# Patient Record
Sex: Male | Born: 1970 | Race: Black or African American | Hispanic: No | Marital: Single | Smoking: Current every day smoker
Health system: Southern US, Community
[De-identification: ages and names within clinical notes are randomized; demographics above are authoritative.]

## PROBLEM LIST (undated history)

## (undated) DIAGNOSIS — H9190 Unspecified hearing loss, unspecified ear: Secondary | ICD-10-CM

## (undated) HISTORY — PX: EYE SURGERY: SHX253

---

## 2013-10-09 ENCOUNTER — Ambulatory Visit: Payer: Self-pay | Admitting: Family Medicine

## 2013-10-13 ENCOUNTER — Ambulatory Visit: Payer: Self-pay | Admitting: Physician Assistant

## 2014-11-02 ENCOUNTER — Ambulatory Visit
Admit: 2014-11-02 | Disposition: A | Discharge status: Home / Self Care | Payer: Self-pay | Attending: Family Medicine | Admitting: Family Medicine

## 2016-09-13 ENCOUNTER — Ambulatory Visit
Admission: EM | Admit: 2016-09-13 | Discharge: 2016-09-13 | Disposition: A | Discharge status: Home / Self Care | Payer: Medicaid Other | Attending: Family Medicine | Admitting: Family Medicine

## 2016-09-13 ENCOUNTER — Encounter: Payer: Self-pay | Admitting: Emergency Medicine

## 2016-09-13 DIAGNOSIS — L03213 Periorbital cellulitis: Secondary | ICD-10-CM | POA: Diagnosis not present

## 2016-09-13 DIAGNOSIS — J0100 Acute maxillary sinusitis, unspecified: Secondary | ICD-10-CM

## 2016-09-13 MED ORDER — SULFAMETHOXAZOLE-TRIMETHOPRIM 800-160 MG PO TABS: 1.0000 | ORAL_TABLET | Freq: Two times a day (BID) | ORAL | 0 refills | Status: DC

## 2016-09-13 NOTE — ED Provider Notes (Signed)
[facility]    CSN: [id number] Arrival date & time: 09/13/16  0901     History   Chief Complaint Chief Complaint  Patient presents with  . Facial Pain  . Eye Problem    HPI Wayne Ward is a 46 y.o. male.   The history is provided by the patient.  Eye Problem  Location:  Left eye (lower eyelid) Quality:  Tearing Severity:  Mild Onset quality:  Sudden Duration:  1 week Timing:  Constant Progression:  Worsening Context: not burn, not chemical exposure, not contact lens problem, not direct trauma, not foreign body, not using machinery, not scratch, not smoke exposure and not UV exposure   Relieved by:  None tried Associated symptoms: headaches   URI  Presenting symptoms: congestion, facial pain and rhinorrhea   Severity:  Moderate Onset quality:  Sudden Duration:  3 weeks Timing:  Constant Progression:  Worsening Chronicity:  New Relieved by:  Nothing Ineffective treatments:  OTC medications Associated symptoms: headaches and sinus pain   Associated symptoms: no wheezing   Risk factors: sick contacts   Risk factors: not elderly, no chronic cardiac disease, no chronic kidney disease, no chronic respiratory disease, no diabetes mellitus, no immunosuppression, no recent illness and no recent travel     History reviewed. No pertinent past medical history.  There are no active problems to display for this patient.   Past Surgical History:  Procedure Laterality Date  . EYE SURGERY         Home Medications    Prior to Admission medications   Medication Sig Start Date End Date Taking? Authorizing Provider  sulfamethoxazole-trimethoprim (BACTRIM DS,SEPTRA DS) 800-160 MG tablet Take 1 tablet by mouth 2 (two) times daily. 09/13/16   Payton Mccallum, MD    Family History History reviewed. No pertinent family history.  Social History Social History  Substance Use Topics  . Smoking status: Current Every Day Smoker    Types: Cigarettes  .  Smokeless tobacco: Never Used  . Alcohol use Yes     Allergies   Penicillins   Review of Systems Review of Systems  HENT: Positive for congestion, rhinorrhea and sinus pain.   Respiratory: Negative for wheezing.   Neurological: Positive for headaches.     Physical Exam Triage Vital Signs ED Triage Vitals  Enc Vitals Group     BP 09/13/16 0922 (S) (!) 144/106     Pulse Rate 09/13/16 0922 88     Resp 09/13/16 0922 16     Temp 09/13/16 0922 98.1 F (36.7 C)     Temp Source 09/13/16 0922 Oral     SpO2 09/13/16 0922 100 %     Weight 09/13/16 0920 160 lb (72.6 kg)     Height 09/13/16 0920 6\' 1"  (1.854 m)     Head Circumference --      Peak Flow --      Pain Score 09/13/16 0924 4     Pain Loc --      Pain Edu? --      Excl. in GC? --    No data found.   Updated Vital Signs BP (S) (!) 144/106 (BP Location: Left Arm)   Pulse 88   Temp 98.1 F (36.7 C) (Oral)   Resp 16   Ht 6\' 1"  (1.854 m)   Wt 160 lb (72.6 kg)   SpO2 100%   BMI 21.11 kg/m   Visual Acuity Right Eye Distance:   Left Eye Distance:  Bilateral Distance:    Right Eye Near:   Left Eye Near:    Bilateral Near:     Physical Exam  Constitutional: He appears well-developed and well-nourished. No distress.  HENT:  Head: Normocephalic and atraumatic.  Right Ear: Tympanic membrane, external ear and ear canal normal.  Left Ear: Tympanic membrane, external ear and ear canal normal.  Nose: Right sinus exhibits maxillary sinus tenderness and frontal sinus tenderness. Left sinus exhibits maxillary sinus tenderness and frontal sinus tenderness.  Mouth/Throat: Uvula is midline, oropharynx is clear and moist and mucous membranes are normal. No oropharyngeal exudate or tonsillar abscesses.  Eyes: Conjunctivae and EOM are normal. Pupils are equal, round, and reactive to light. Right eye exhibits no discharge. Left eye exhibits no discharge. No scleral icterus.  Left lower eyelid with mild tenderness to  palpation, skin erythema, blanchable and warmth  Neck: Normal range of motion. Neck supple. No tracheal deviation present. No thyromegaly present.  Cardiovascular: Normal rate, regular rhythm and normal heart sounds.   Pulmonary/Chest: Effort normal and breath sounds normal. No stridor. No respiratory distress. He has no wheezes. He has no rales. He exhibits no tenderness.  Lymphadenopathy:    He has no cervical adenopathy.  Neurological: He is alert.  Skin: Skin is warm and dry. No rash noted. He is not diaphoretic.  Nursing note and vitals reviewed.    UC Treatments / Results  Labs (all labs ordered are listed, but only abnormal results are displayed) Labs Reviewed - No data to display  EKG  EKG Interpretation None       Radiology No results found.  Procedures Procedures (including critical care time)  Medications Ordered in UC Medications - No data to display   Initial Impression / Assessment and Plan / UC Course  I have reviewed the triage vital signs and the nursing notes.  Pertinent labs & imaging results that were available during my care of the patient were reviewed by me and considered in my medical decision making (see chart for details).       Final Clinical Impressions(s) / UC Diagnoses   Final diagnoses:  Acute maxillary sinusitis, recurrence not specified  Periorbital cellulitis of left eye    New Prescriptions Discharge Medication List as of 09/13/2016 10:06 AM    START taking these medications   Details  sulfamethoxazole-trimethoprim (BACTRIM DS,SEPTRA DS) 800-160 MG tablet Take 1 tablet by mouth 2 (two) times daily., Starting Tue 09/13/2016, Normal       1.  diagnosis reviewed with patient 2. rx as per orders above; reviewed possible side effects, interactions, risks and benefits  3. Recommend supportive treatment with warm compresses, otc analgesics 4. Follow-up prn if symptoms worsen or don't improve   Payton Mccallumrlando Vontae Court, MD 09/13/16  1104

## 2016-09-13 NOTE — ED Notes (Signed)
Sign Language Interpreter was used during triage visit.

## 2016-09-13 NOTE — ED Triage Notes (Signed)
Patient c/o sinus pain and congestion, HAs and swelling on the left side of his face for almost month.  Patient denies fever.

## 2021-08-30 ENCOUNTER — Emergency Department (HOSPITAL_COMMUNITY)
Admission: EM | Admit: 2021-08-30 | Discharge: 2021-08-30 | Disposition: A | Discharge status: Home / Self Care | Payer: Medicare Other | Source: Home / Self Care

## 2021-08-31 ENCOUNTER — Encounter (HOSPITAL_COMMUNITY): Payer: Self-pay | Admitting: *Deleted

## 2021-08-31 ENCOUNTER — Emergency Department (HOSPITAL_COMMUNITY)
Admission: EM | Admit: 2021-08-31 | Discharge: 2021-08-31 | Disposition: A | Discharge status: Home / Self Care | Payer: Medicare Other | Attending: Emergency Medicine | Admitting: Emergency Medicine

## 2021-08-31 ENCOUNTER — Emergency Department (HOSPITAL_COMMUNITY): Payer: Medicare Other

## 2021-08-31 DIAGNOSIS — S80911A Unspecified superficial injury of right knee, initial encounter: Secondary | ICD-10-CM | POA: Diagnosis present

## 2021-08-31 DIAGNOSIS — W1830XA Fall on same level, unspecified, initial encounter: Secondary | ICD-10-CM | POA: Diagnosis not present

## 2021-08-31 DIAGNOSIS — Z79899 Other long term (current) drug therapy: Secondary | ICD-10-CM | POA: Insufficient documentation

## 2021-08-31 DIAGNOSIS — S81811A Laceration without foreign body, right lower leg, initial encounter: Secondary | ICD-10-CM | POA: Diagnosis not present

## 2021-08-31 DIAGNOSIS — Z23 Encounter for immunization: Secondary | ICD-10-CM | POA: Diagnosis not present

## 2021-08-31 HISTORY — DX: Unspecified hearing loss, unspecified ear: H91.90

## 2021-08-31 MED ORDER — TETANUS-DIPHTH-ACELL PERTUSSIS 5-2.5-18.5 LF-MCG/0.5 IM SUSY
0.5000 mL | PREFILLED_SYRINGE | Freq: Once | INTRAMUSCULAR | Status: AC
  Administered 2021-08-31: 0.5 mL via INTRAMUSCULAR
  Filled 2021-08-31: qty 0.5

## 2021-08-31 MED ORDER — DOXYCYCLINE HYCLATE 100 MG PO CAPS: 100.0000 mg | ORAL_CAPSULE | Freq: Two times a day (BID) | ORAL | 0 refills | Status: AC

## 2021-08-31 MED ORDER — LIDOCAINE-EPINEPHRINE (PF) 2 %-1:200000 IJ SOLN
10.0000 mL | Freq: Once | INTRAMUSCULAR | Status: AC
  Administered 2021-08-31: 10 mL
  Filled 2021-08-31: qty 20

## 2021-08-31 NOTE — Discharge Instructions (Signed)
Received a total of 3 sutures, please abstain from getting it wet for the first 24 hours after that like you to rinse out the wound and change out the dressings 2 times daily, started you on antibiotics please take as prescribed.  May use over-the-counter pain medication as needed  Follow-up next 10 to 12 days for suture removal may come back here, urgent care, PCP  Come back to the emergency department if you increased redness, pain, swelling or discharge from the area develop chest pain, shortness of breath, severe abdominal pain, uncontrolled nausea, vomiting, diarrhea.

## 2021-08-31 NOTE — ED Provider Notes (Signed)
[facility] EMERGENCY DEPARTMENT Provider Note   CSN: [id number] Arrival date & time: 08/31/21  1406     History  Chief Complaint  Patient presents with   Laceration    Wayne Ward is a 51 y.o. male.  HPI  Patient with medical history including deaf presents with complaints of a laceration to his right knee.  Patient's brother was at bedside and was able to translate for me.  Patient states that yesterday he had fallen and his knee fell onto a rake, he denies hitting his head, losing conscious, is not on anticoag's.  He states his only complaint is his right knee, he has a laceration which they cleaned out thoroughly, he has some slight pain in that area, denies any paresthesia or weakness in that leg, he is not immunocompromise, does not remember the last time he had his tetanus shot.  He denies any headaches, change in vision, paresthesia or weakness of the lower extremities, denies any neck pain back pain pain 4 extremities.    Home Medications Prior to Admission medications   Medication Sig Start Date End Date Taking? Authorizing Provider  doxycycline (VIBRAMYCIN) 100 MG capsule Take 1 capsule (100 mg total) by mouth 2 (two) times daily for 7 days. 08/31/21 09/07/21 Yes Carroll Sage, PA-C  lisinopril (ZESTRIL) 10 MG tablet Take 1 tablet by mouth daily. Patient not taking: Reported on 08/31/2021 09/02/18   [provider]  sulfamethoxazole-trimethoprim (BACTRIM DS,SEPTRA DS) 800-160 MG tablet Take 1 tablet by mouth 2 (two) times daily. Patient not taking: Reported on 08/31/2021 09/13/16   Payton Mccallum, MD      Allergies    Penicillins    Review of Systems   Review of Systems  Constitutional:  Negative for chills and fever.  Respiratory:  Negative for shortness of breath.   Cardiovascular:  Negative for chest pain.  Gastrointestinal:  Negative for abdominal pain.  Skin:  Positive for wound.  Neurological:  Negative for headaches.   Physical Exam Updated Vital  Signs BP (!) 153/93    Pulse 100    Temp 99.5 F (37.5 C) (Oral)    Resp 20    SpO2 100%  Physical Exam Vitals and nursing note reviewed.  Constitutional:      General: He is not in acute distress.    Appearance: He is not ill-appearing.  HENT:     Head: Normocephalic and atraumatic.     Nose: No congestion.  Eyes:     Conjunctiva/sclera: Conjunctivae normal.  Cardiovascular:     Rate and Rhythm: Normal rate and regular rhythm.     Pulses: Normal pulses.  Pulmonary:     Effort: Pulmonary effort is normal.  Musculoskeletal:     Comments: Patient has a noted V-shaped laceration on the anterior proximal aspect of the tibia, measuring proximately 4 cm in length, 3 mm in depth, wound gapes, noted skin flap, no noted tendon damage, no foreign body present, he has full range of motion, 5 out of 5 strength in his ankle and knee neurovascular fully intact please see picture for full detail  Skin:    General: Skin is warm and dry.  Neurological:     Mental Status: He is alert.     Comments: No facial asymmetry, no unilateral weakness present.  Psychiatric:        Mood and Affect: Mood normal.       ED Results / Procedures / Treatments   Labs (all labs ordered are listed, but  only abnormal results are displayed) Labs Reviewed - No data to display  EKG None  Radiology No results found.  Procedures .Marland KitchenLaceration Repair  Date/Time: 08/31/2021 6:23 PM Performed by: Carroll Sage, PA-C Authorized by: Carroll Sage, PA-C   Consent:    Consent obtained:  Verbal   Consent given by:  Patient   Risks discussed:  Infection, pain, retained foreign body, need for additional repair, poor cosmetic result, tendon damage, vascular damage, poor wound healing and nerve damage   Alternatives discussed:  No treatment, delayed treatment, observation and referral Universal protocol:    Patient identity confirmed:  Verbally with patient Anesthesia:    Anesthesia method:  Local  infiltration   Local anesthetic:  Lidocaine 2% WITH epi Laceration details:    Location:  Leg   Leg location:  R upper leg   Length (cm):  4   Depth (mm):  3 Pre-procedure details:    Preparation:  Patient was prepped and draped in usual sterile fashion and imaging obtained to evaluate for foreign bodies Exploration:    Limited defect created (wound extended): no     Imaging obtained: x-ray     Imaging outcome: foreign body not noted     Wound exploration: wound explored through full range of motion and entire depth of wound visualized     Contaminated: no   Treatment:    Area cleansed with:  Saline   Amount of cleaning:  Standard   Visualized foreign bodies/material removed: no     Debridement:  None Skin repair:    Repair method:  Sutures   Suture size:  4-0   Suture material:  Prolene   Suture technique:  Simple interrupted   Number of sutures:  3 Approximation:    Approximation:  Loose Repair type:    Repair type:  Simple Post-procedure details:    Dressing:  Non-adherent dressing   Procedure completion:  Tolerated well, no immediate complications    Medications Ordered in ED Medications  Tdap (BOOSTRIX) injection 0.5 mL (0.5 mLs Intramuscular Given 08/31/21 1635)  lidocaine-EPINEPHrine (XYLOCAINE W/EPI) 2 %-1:200000 (PF) injection 10 mL (10 mLs Infiltration Given 08/31/21 1635)    ED Course/ Medical Decision Making/ A&P                           Medical Decision Making Amount and/or Complexity of Data Reviewed Radiology: ordered.  Risk Prescription drug management.   This patient presents to the ED for concern of fall, this involves an extensive number of treatment options, and is a complaint that carries with it a high risk of complications and morbidity.  The differential diagnosis includes fracture, dislocation, tendon damage    Additional history obtained:  Additional history obtained from brother who is at bedside    Co morbidities that complicate  the patient evaluation  N/A  Social Determinants of Health:  Legally deaf    Lab Tests:  I Ordered, and personally interpreted labs.  The pertinent results include: N/A   Imaging Studies ordered:  I ordered imaging studies including the x-ray I independently visualized and interpreted imaging which showed negative for acute findings I agree with the radiologist interpretation    Reevaluation:  Since patient's wound gaped and has a skin flap will recommend loose sutures to decrease infection risk and to assist with the healing process.  Patient was agreeable with this and tolerated the procedure well. he received 3 sutures.  Neurovascular was fully intact  after the procedure.  Patient's tetanus shot was updated.     Rule out  Low suspicion for fracture or dislocation as x-ray does not reveal any acute findings. low suspicion for  tendon damage as area was palpated no gross defects noted, he had full range of motion in his knee and ankle.   Low suspicion for compartment syndrome as area was palpated it was soft to the touch, neurovascular fully intact before and after the procedure    Dispostion and problem list  After consideration of the diagnostic results and the patients response to treatment, I feel that the patent would benefit from discharge.  Laceration-received a total of 3 sutures, will start him on antibiotics, have him follow-up next 10 to 12 days for suture removal.  Given strict return precautions.            Final Clinical Impression(s) / ED Diagnoses Final diagnoses:  Laceration of right lower extremity, initial encounter    Rx / DC Orders ED Discharge Orders          Ordered    doxycycline (VIBRAMYCIN) 100 MG capsule  2 times daily        08/31/21 1825              Carroll Sage, PA-C 08/31/21 1826    Pollyann Savoy, MD 08/31/21 2320

## 2021-08-31 NOTE — ED Notes (Signed)
Band-aid applied

## 2021-08-31 NOTE — ED Triage Notes (Signed)
Laceration to right knee, fell on a rake yeasterday

## 2022-05-09 ENCOUNTER — Emergency Department (HOSPITAL_COMMUNITY): Payer: Medicare Other

## 2022-05-09 ENCOUNTER — Inpatient Hospital Stay (HOSPITAL_COMMUNITY)
Admission: EM | Admit: 2022-05-09 | Discharge: 2022-05-12 | DRG: 917 | Disposition: A | Discharge status: Home / Self Care | Payer: Medicare Other | Attending: Critical Care Medicine | Admitting: Critical Care Medicine

## 2022-05-09 DIAGNOSIS — T510X1A Toxic effect of ethanol, accidental (unintentional), initial encounter: Secondary | ICD-10-CM | POA: Diagnosis present

## 2022-05-09 DIAGNOSIS — E162 Hypoglycemia, unspecified: Secondary | ICD-10-CM | POA: Diagnosis present

## 2022-05-09 DIAGNOSIS — E876 Hypokalemia: Secondary | ICD-10-CM | POA: Diagnosis present

## 2022-05-09 DIAGNOSIS — R4182 Altered mental status, unspecified: Secondary | ICD-10-CM | POA: Diagnosis not present

## 2022-05-09 DIAGNOSIS — T50901A Poisoning by unspecified drugs, medicaments and biological substances, accidental (unintentional), initial encounter: Secondary | ICD-10-CM | POA: Diagnosis present

## 2022-05-09 DIAGNOSIS — R9431 Abnormal electrocardiogram [ECG] [EKG]: Secondary | ICD-10-CM | POA: Diagnosis present

## 2022-05-09 DIAGNOSIS — R451 Restlessness and agitation: Secondary | ICD-10-CM

## 2022-05-09 DIAGNOSIS — J9601 Acute respiratory failure with hypoxia: Secondary | ICD-10-CM | POA: Diagnosis present

## 2022-05-09 DIAGNOSIS — T50904A Poisoning by unspecified drugs, medicaments and biological substances, undetermined, initial encounter: Principal | ICD-10-CM

## 2022-05-09 DIAGNOSIS — J690 Pneumonitis due to inhalation of food and vomit: Secondary | ICD-10-CM | POA: Diagnosis present

## 2022-05-09 DIAGNOSIS — H9190 Unspecified hearing loss, unspecified ear: Secondary | ICD-10-CM | POA: Diagnosis present

## 2022-05-09 DIAGNOSIS — Y902 Blood alcohol level of 40-59 mg/100 ml: Secondary | ICD-10-CM | POA: Diagnosis present

## 2022-05-09 DIAGNOSIS — H548 Legal blindness, as defined in USA: Secondary | ICD-10-CM | POA: Diagnosis present

## 2022-05-09 DIAGNOSIS — F1010 Alcohol abuse, uncomplicated: Secondary | ICD-10-CM | POA: Diagnosis present

## 2022-05-09 DIAGNOSIS — T405X1A Poisoning by cocaine, accidental (unintentional), initial encounter: Principal | ICD-10-CM | POA: Diagnosis present

## 2022-05-09 DIAGNOSIS — R739 Hyperglycemia, unspecified: Secondary | ICD-10-CM | POA: Diagnosis not present

## 2022-05-09 DIAGNOSIS — G928 Other toxic encephalopathy: Secondary | ICD-10-CM | POA: Diagnosis present

## 2022-05-09 DIAGNOSIS — Z880 Allergy status to penicillin: Secondary | ICD-10-CM

## 2022-05-09 DIAGNOSIS — F17210 Nicotine dependence, cigarettes, uncomplicated: Secondary | ICD-10-CM | POA: Diagnosis present

## 2022-05-09 DIAGNOSIS — F1410 Cocaine abuse, uncomplicated: Secondary | ICD-10-CM | POA: Diagnosis present

## 2022-05-09 DIAGNOSIS — I10 Essential (primary) hypertension: Secondary | ICD-10-CM | POA: Diagnosis present

## 2022-05-09 LAB — CBC WITH DIFFERENTIAL/PLATELET
Abs Immature Granulocytes: 0.04 10*3/uL (ref 0.00–0.07)
Basophils Absolute: 0 10*3/uL (ref 0.0–0.1)
Basophils Relative: 1 %
Eosinophils Absolute: 0.1 10*3/uL (ref 0.0–0.5)
Eosinophils Relative: 1 %
HCT: 41.5 % (ref 39.0–52.0)
Hemoglobin: 13.6 g/dL (ref 13.0–17.0)
Immature Granulocytes: 1 %
Lymphocytes Relative: 25 %
Lymphs Abs: 2 10*3/uL (ref 0.7–4.0)
MCH: 29.2 pg (ref 26.0–34.0)
MCHC: 32.8 g/dL (ref 30.0–36.0)
MCV: 89.2 fL (ref 80.0–100.0)
Monocytes Absolute: 1 10*3/uL (ref 0.1–1.0)
Monocytes Relative: 12 %
Neutro Abs: 4.9 10*3/uL (ref 1.7–7.7)
Neutrophils Relative %: 60 %
Platelets: 235 10*3/uL (ref 150–400)
RBC: 4.65 MIL/uL (ref 4.22–5.81)
RDW: 13.1 % (ref 11.5–15.5)
WBC: 8 10*3/uL (ref 4.0–10.5)
nRBC: 0 % (ref 0.0–0.2)

## 2022-05-09 LAB — ETHANOL: Alcohol, Ethyl (B): 55 mg/dL — ABNORMAL HIGH (ref <10)

## 2022-05-09 MED ORDER — SODIUM CHLORIDE 0.9 % IV BOLUS
1000.0000 mL | Freq: Once | INTRAVENOUS | Status: AC
  Administered 2022-05-10: 1000 mL via INTRAVENOUS

## 2022-05-09 MED ORDER — LORAZEPAM 2 MG/ML IJ SOLN
INTRAMUSCULAR | Status: AC
  Administered 2022-05-09: 2 mg
  Filled 2022-05-09: qty 1

## 2022-05-09 MED ORDER — NALOXONE HCL 0.4 MG/ML IJ SOLN
2.0000 mg | Freq: Once | INTRAMUSCULAR | Status: AC
  Administered 2022-05-09: 2 mg via INTRAVENOUS
  Filled 2022-05-09: qty 5

## 2022-05-09 MED ORDER — NALOXONE HCL 0.4 MG/ML IJ SOLN: 0.4000 mg | Freq: Once | INTRAMUSCULAR | Status: DC

## 2022-05-09 MED ORDER — ROCURONIUM BROMIDE 10 MG/ML (PF) SYRINGE
PREFILLED_SYRINGE | INTRAVENOUS | Status: AC
  Administered 2022-05-09: 100 mg
  Filled 2022-05-09: qty 10

## 2022-05-09 MED ORDER — ETOMIDATE 2 MG/ML IV SOLN
INTRAVENOUS | Status: AC
  Administered 2022-05-09: 20 mg
  Filled 2022-05-09: qty 20

## 2022-05-09 MED ORDER — PROPOFOL 1000 MG/100ML IV EMUL
INTRAVENOUS | Status: AC
  Filled 2022-05-09: qty 100

## 2022-05-09 MED ORDER — SUCCINYLCHOLINE CHLORIDE 200 MG/10ML IV SOSY
PREFILLED_SYRINGE | INTRAVENOUS | Status: AC
  Filled 2022-05-09: qty 10

## 2022-05-09 NOTE — ED Notes (Signed)
X-ray at bedside

## 2022-05-09 NOTE — ED Notes (Signed)
2 mg of Narcan given

## 2022-05-09 NOTE — ED Provider Notes (Signed)
  Provider Note MRN:  [id number]  Arrival date & time: 05/10/22    ED Course and Medical Decision Making  Assumed care from Dr. Rubin Payor at shift change.  On my assessment patient is agitated, combative, kicking, does not respond to painful stimuli.  Pupils large.  Differential diagnosis includes mixed overdose with opioids now blocked from Narcan, intracranial bleeding.  Given that he is quite altered, sonorous but combative, not really protecting his airway, decision was made to intubate as described below.  Accepted for admission by ICU service at [facility].    Procedure Name: Intubation Date/Time: 05/10/2022 12:00 AM  Performed by: Sabas Sous, MDPre-anesthesia Checklist: Patient identified, Patient being monitored, Emergency Drugs available, Timeout performed and Suction available Oxygen Delivery Method: Non-rebreather mask Preoxygenation: Pre-oxygenation with 100% oxygen Induction Type: Rapid sequence Ventilation: Mask ventilation without difficulty Laryngoscope Size: Mac and 4 Grade View: Grade II Tube size: 7.5 mm Number of attempts: 1 Airway Equipment and Method: Stylet Placement Confirmation: ETT inserted through vocal cords under direct vision, CO2 detector and Breath sounds checked- equal and bilateral Secured at: 26 cm Tube secured with: ETT holder Comments: RSI intubation with 2 mg Ativan to aid with combativeness followed by 20 mg etomidate, 100 mg rocuronium.      Final Clinical Impressions(s) / ED Diagnoses     ICD-10-CM   1. Drug overdose of undetermined intent, initial encounter  T50.904A     2. Altered mental status, unspecified altered mental status type  R41.82     3. Agitation  R45.1       ED Discharge Orders     None       Discharge Instructions   None     Elmer Sow. Pilar Plate, MD [facility] .edu    Sabas Sous, MD 05/10/22 [phone]

## 2022-05-09 NOTE — ED Notes (Signed)
CBG 180 This NTs badge scanned

## 2022-05-09 NOTE — ED Provider Notes (Signed)
[facility] EMERGENCY DEPARTMENT Provider Note   CSN: [id number] Arrival date & time: 05/09/22  2235     History  Chief Complaint  Patient presents with   Drug Overdose    Rondel Abalos is a 51 y.o. male.   Drug Overdose  Patient brought in reportedly for drug overdose.  Reportedly people at the house were not much help although patient's brother reportedly woke up after Narcan.  Patient reportedly was doing cocaine.  Unresponsive here.  Had 4 mg of Narcan for EMS without response.  CBG done upon arrival and was 180.  Minimal acute to no gag reflex.  Moving all extremities however.  Pupils mildly dilated and left larger than right.  Patient is somewhat wet all over.  Patient is reportedly deaf.    Past Medical History:  Diagnosis Date   Deaf     Home Medications Prior to Admission medications   Medication Sig Start Date End Date Taking? Authorizing Provider  lisinopril (ZESTRIL) 10 MG tablet Take 1 tablet by mouth daily. Patient not taking: Reported on 08/31/2021 09/02/18   [provider]  sulfamethoxazole-trimethoprim (BACTRIM DS,SEPTRA DS) 800-160 MG tablet Take 1 tablet by mouth 2 (two) times daily. Patient not taking: Reported on 08/31/2021 09/13/16   Payton Mccallum, MD      Allergies    Penicillins    Review of Systems   Review of Systems  Physical Exam Updated Vital Signs Pulse 79   Resp 17   SpO2 100%  Physical Exam Vitals and nursing note reviewed.  HENT:     Head: Atraumatic.  Cardiovascular:     Rate and Rhythm: Regular rhythm.  Pulmonary:     Breath sounds: No wheezing or rhonchi.     Comments: Patient with snoring respirations. Musculoskeletal:        General: No tenderness.  Neurological:     Comments: Snoring respirations.  Minimal to no gag reflex.  Pupils somewhat asymmetric.  Moving all extremities however.     ED Results / Procedures / Treatments   Labs (all labs ordered are listed, but only abnormal results are displayed) Labs  Reviewed  CBC WITH DIFFERENTIAL/PLATELET  COMPREHENSIVE METABOLIC PANEL  ETHANOL  RAPID URINE DRUG SCREEN, HOSP PERFORMED  URINALYSIS, ROUTINE W REFLEX MICROSCOPIC    EKG EKG Interpretation  Date/Time:  Monday May 09 2022 22:40:28 EDT Ventricular Rate:  85 PR Interval:  169 QRS Duration: 123 QT Interval:  445 QTC Calculation: 530 R Axis:   63 Text Interpretation: Sinus rhythm IVCD, consider atypical RBBB Probable left ventricular hypertrophy Prolonged QT interval No old tracing to compare Confirmed by Benjiman Core [phone]) on 05/09/2022 11:06:40 PM  Radiology DG Chest Portable 1 View  Result Date: 05/09/2022 CLINICAL DATA:  Altered mental status EXAM: PORTABLE CHEST 1 VIEW COMPARISON:  None Available. FINDINGS: Heart and mediastinal contours are within normal limits. No focal opacities or effusions. No acute bony abnormality. IMPRESSION: No active disease. Electronically Signed   By: Charlett Nose M.D.   On: 05/09/2022 23:02    Procedures Procedures    Medications Ordered in ED Medications  naloxone (NARCAN) injection 2 mg (has no administration in time range)  rocuronium bromide 100 MG/10ML SOSY (has no administration in time range)  succinylcholine (ANECTINE) 200 MG/10ML syringe (has no administration in time range)  etomidate (AMIDATE) 2 MG/ML injection (has no administration in time range)  LORazepam (ATIVAN) 2 MG/ML injection (has no administration in time range)  propofol (DIPRIVAN) 1000 MG/100ML infusion (has no administration  in time range)  sodium chloride 0.9 % bolus 1,000 mL (has no administration in time range)    ED Course/ Medical Decision Making/ A&P                           Medical Decision Making Amount and/or Complexity of Data Reviewed Labs: ordered. Radiology: ordered.  Risk Prescription drug management.   Patient came in unresponsive.  Reportedly drugs were doing done at the place he was at and reportedly had done cocaine although  patient is unresponsive and really cannot provide any history.  Weak gag reflex start with.  Sugar reassuring.  Mental status deteriorated somewhat and required intubation.        Final Clinical Impression(s) / ED Diagnoses Final diagnoses:  None    Rx / DC Orders ED Discharge Orders     None         Davonna Belling, MD 05/09/22 2330

## 2022-05-09 NOTE — ED Triage Notes (Signed)
Pt to ed via [facility] c/o overdose. Pt is unresponsive at this time. Pt given 4mg  narcan via ems. Ems bvm to supplement breathing. O2 sat upon arrival 100% RA.

## 2022-05-10 ENCOUNTER — Emergency Department (HOSPITAL_COMMUNITY): Payer: Medicare Other

## 2022-05-10 ENCOUNTER — Encounter (HOSPITAL_COMMUNITY): Payer: Self-pay | Admitting: Pulmonary Disease

## 2022-05-10 ENCOUNTER — Other Ambulatory Visit: Payer: Self-pay

## 2022-05-10 DIAGNOSIS — E876 Hypokalemia: Secondary | ICD-10-CM | POA: Diagnosis present

## 2022-05-10 DIAGNOSIS — Y902 Blood alcohol level of 40-59 mg/100 ml: Secondary | ICD-10-CM | POA: Diagnosis present

## 2022-05-10 DIAGNOSIS — T50901A Poisoning by unspecified drugs, medicaments and biological substances, accidental (unintentional), initial encounter: Secondary | ICD-10-CM | POA: Diagnosis present

## 2022-05-10 DIAGNOSIS — Z9911 Dependence on respirator [ventilator] status: Secondary | ICD-10-CM | POA: Diagnosis not present

## 2022-05-10 DIAGNOSIS — H548 Legal blindness, as defined in USA: Secondary | ICD-10-CM | POA: Diagnosis present

## 2022-05-10 DIAGNOSIS — T50904D Poisoning by unspecified drugs, medicaments and biological substances, undetermined, subsequent encounter: Secondary | ICD-10-CM

## 2022-05-10 DIAGNOSIS — G928 Other toxic encephalopathy: Secondary | ICD-10-CM | POA: Diagnosis present

## 2022-05-10 DIAGNOSIS — Z880 Allergy status to penicillin: Secondary | ICD-10-CM | POA: Diagnosis not present

## 2022-05-10 DIAGNOSIS — F1010 Alcohol abuse, uncomplicated: Secondary | ICD-10-CM | POA: Diagnosis present

## 2022-05-10 DIAGNOSIS — H9190 Unspecified hearing loss, unspecified ear: Secondary | ICD-10-CM | POA: Diagnosis present

## 2022-05-10 DIAGNOSIS — J690 Pneumonitis due to inhalation of food and vomit: Secondary | ICD-10-CM | POA: Diagnosis present

## 2022-05-10 DIAGNOSIS — R9431 Abnormal electrocardiogram [ECG] [EKG]: Secondary | ICD-10-CM | POA: Diagnosis present

## 2022-05-10 DIAGNOSIS — R4182 Altered mental status, unspecified: Secondary | ICD-10-CM | POA: Diagnosis present

## 2022-05-10 DIAGNOSIS — T510X1A Toxic effect of ethanol, accidental (unintentional), initial encounter: Secondary | ICD-10-CM | POA: Diagnosis present

## 2022-05-10 DIAGNOSIS — F1410 Cocaine abuse, uncomplicated: Secondary | ICD-10-CM | POA: Diagnosis present

## 2022-05-10 DIAGNOSIS — R739 Hyperglycemia, unspecified: Secondary | ICD-10-CM | POA: Diagnosis not present

## 2022-05-10 DIAGNOSIS — I10 Essential (primary) hypertension: Secondary | ICD-10-CM | POA: Diagnosis present

## 2022-05-10 DIAGNOSIS — E162 Hypoglycemia, unspecified: Secondary | ICD-10-CM | POA: Diagnosis present

## 2022-05-10 DIAGNOSIS — F17210 Nicotine dependence, cigarettes, uncomplicated: Secondary | ICD-10-CM | POA: Diagnosis present

## 2022-05-10 DIAGNOSIS — T405X1A Poisoning by cocaine, accidental (unintentional), initial encounter: Secondary | ICD-10-CM | POA: Diagnosis present

## 2022-05-10 DIAGNOSIS — J9601 Acute respiratory failure with hypoxia: Secondary | ICD-10-CM | POA: Diagnosis present

## 2022-05-10 LAB — CBG MONITORING, ED
Glucose-Capillary: 180 mg/dL — ABNORMAL HIGH (ref 70–99)
Glucose-Capillary: 70 mg/dL (ref 70–99)

## 2022-05-10 LAB — COMPREHENSIVE METABOLIC PANEL
ALT: 19 U/L (ref 0–44)
AST: 37 U/L (ref 15–41)
Albumin: 4.4 g/dL (ref 3.5–5.0)
Alkaline Phosphatase: 64 U/L (ref 38–126)
Anion gap: 16 — ABNORMAL HIGH (ref 5–15)
BUN: 13 mg/dL (ref 6–20)
CO2: 21 mmol/L — ABNORMAL LOW (ref 22–32)
Calcium: 8.6 mg/dL — ABNORMAL LOW (ref 8.9–10.3)
Chloride: 102 mmol/L (ref 98–111)
Creatinine, Ser: 0.93 mg/dL (ref 0.61–1.24)
GFR, Estimated: >60 mL/min (ref >60)
Glucose, Bld: 148 mg/dL — ABNORMAL HIGH (ref 70–99)
Potassium: 3.4 mmol/L — ABNORMAL LOW (ref 3.5–5.1)
Sodium: 139 mmol/L (ref 135–145)
Total Bilirubin: 0.6 mg/dL (ref 0.3–1.2)
Total Protein: 7.8 g/dL (ref 6.5–8.1)

## 2022-05-10 LAB — RAPID URINE DRUG SCREEN, HOSP PERFORMED
Amphetamines: NOT DETECTED
Barbiturates: NOT DETECTED
Benzodiazepines: NOT DETECTED
Cocaine: POSITIVE — AB
Opiates: NOT DETECTED
Tetrahydrocannabinol: NOT DETECTED

## 2022-05-10 LAB — URINALYSIS, ROUTINE W REFLEX MICROSCOPIC
Bilirubin Urine: NEGATIVE
Glucose, UA: >=500 mg/dL — AB
Ketones, ur: NEGATIVE mg/dL
Leukocytes,Ua: NEGATIVE
Nitrite: NEGATIVE
Protein, ur: NEGATIVE mg/dL
Specific Gravity, Urine: 1.007 (ref 1.005–1.030)
pH: 6 (ref 5.0–8.0)

## 2022-05-10 LAB — GLUCOSE, CAPILLARY
Glucose-Capillary: 137 mg/dL — ABNORMAL HIGH (ref 70–99)
Glucose-Capillary: 55 mg/dL — ABNORMAL LOW (ref 70–99)
Glucose-Capillary: 99 mg/dL (ref 70–99)
Glucose-Capillary: 124 mg/dL — ABNORMAL HIGH (ref 70–99)
Glucose-Capillary: 118 mg/dL — ABNORMAL HIGH (ref 70–99)
Glucose-Capillary: 140 mg/dL — ABNORMAL HIGH (ref 70–99)

## 2022-05-10 LAB — MAGNESIUM: Magnesium: 1.5 mg/dL — ABNORMAL LOW (ref 1.7–2.4)

## 2022-05-10 LAB — BLOOD GAS, ARTERIAL
Acid-base deficit: 3.4 mmol/L — ABNORMAL HIGH (ref 0.0–2.0)
Bicarbonate: 20.3 mmol/L (ref 20.0–28.0)
Drawn by: 21310
FIO2: 100 %
O2 Saturation: 100 %
Patient temperature: 34.2
pCO2 arterial: 28 mmHg — ABNORMAL LOW (ref 32–48)
pH, Arterial: 7.45 (ref 7.35–7.45)
pO2, Arterial: 465 mmHg — ABNORMAL HIGH (ref 83–108)

## 2022-05-10 LAB — POCT I-STAT 7, (LYTES, BLD GAS, ICA,H+H)
Acid-Base Excess: 0 mmol/L (ref 0.0–2.0)
Bicarbonate: 24 mmol/L (ref 20.0–28.0)
Calcium, Ion: 1.19 mmol/L (ref 1.15–1.40)
HCT: 39 % (ref 39.0–52.0)
Hemoglobin: 13.3 g/dL (ref 13.0–17.0)
O2 Saturation: 100 %
Patient temperature: 97
Potassium: 3.8 mmol/L (ref 3.5–5.1)
Sodium: 136 mmol/L (ref 135–145)
TCO2: 25 mmol/L (ref 22–32)
pCO2 arterial: 34.2 mmHg (ref 32–48)
pH, Arterial: 7.449 (ref 7.35–7.45)
pO2, Arterial: 171 mmHg — ABNORMAL HIGH (ref 83–108)

## 2022-05-10 LAB — MRSA NEXT GEN BY PCR, NASAL: MRSA by PCR Next Gen: NOT DETECTED

## 2022-05-10 LAB — LACTIC ACID, PLASMA: Lactic Acid, Venous: 1.6 mmol/L (ref 0.5–1.9)

## 2022-05-10 LAB — PHOSPHORUS: Phosphorus: 2.3 mg/dL — ABNORMAL LOW (ref 2.5–4.6)

## 2022-05-10 LAB — HIV ANTIBODY (ROUTINE TESTING W REFLEX): HIV Screen 4th Generation wRfx: NONREACTIVE

## 2022-05-10 MED ORDER — CHLORHEXIDINE GLUCONATE CLOTH 2 % EX PADS
6.0000 | MEDICATED_PAD | Freq: Every day | CUTANEOUS | Status: DC
  Administered 2022-05-10 – 2022-05-11 (×2): 6 via TOPICAL

## 2022-05-10 MED ORDER — ORAL CARE MOUTH RINSE
15.0000 mL | OROMUCOSAL | Status: DC
  Administered 2022-05-10 – 2022-05-11 (×13): 15 mL via OROMUCOSAL

## 2022-05-10 MED ORDER — ORAL CARE MOUTH RINSE: 15.0000 mL | OROMUCOSAL | Status: DC | PRN

## 2022-05-10 MED ORDER — MAGNESIUM SULFATE 2 GM/50ML IV SOLN
2.0000 g | Freq: Once | INTRAVENOUS | Status: AC
  Administered 2022-05-10: 2 g via INTRAVENOUS
  Filled 2022-05-10: qty 50

## 2022-05-10 MED ORDER — HEPARIN SODIUM (PORCINE) 5000 UNIT/ML IJ SOLN: 5000.0000 [IU] | Freq: Three times a day (TID) | INTRAMUSCULAR | Status: DC

## 2022-05-10 MED ORDER — POTASSIUM PHOSPHATES 15 MMOLE/5ML IV SOLN
15.0000 mmol | Freq: Once | INTRAVENOUS | Status: AC
  Administered 2022-05-10: 15 mmol via INTRAVENOUS
  Filled 2022-05-10: qty 5

## 2022-05-10 MED ORDER — POTASSIUM CHLORIDE 10 MEQ/100ML IV SOLN
10.0000 meq | INTRAVENOUS | Status: AC
  Administered 2022-05-10 (×4): 10 meq via INTRAVENOUS
  Filled 2022-05-10 (×4): qty 100

## 2022-05-10 MED ORDER — LACTATED RINGERS IV BOLUS
1000.0000 mL | Freq: Once | INTRAVENOUS | Status: AC
  Administered 2022-05-10: 1000 mL via INTRAVENOUS

## 2022-05-10 MED ORDER — DEXTROSE 50 % IV SOLN
INTRAVENOUS | Status: AC
  Filled 2022-05-10: qty 50

## 2022-05-10 MED ORDER — POTASSIUM CHLORIDE 20 MEQ PO PACK
40.0000 meq | PACK | Freq: Once | ORAL | Status: AC
  Administered 2022-05-10: 40 meq
  Filled 2022-05-10: qty 2

## 2022-05-10 MED ORDER — ADULT MULTIVITAMIN W/MINERALS CH
1.0000 | ORAL_TABLET | Freq: Every day | ORAL | Status: DC
  Administered 2022-05-11: 1
  Filled 2022-05-10: qty 1

## 2022-05-10 MED ORDER — SODIUM CHLORIDE 0.9 % IV SOLN: INTRAVENOUS | Status: DC | PRN

## 2022-05-10 MED ORDER — DEXTROSE 50 % IV SOLN
12.5000 g | INTRAVENOUS | Status: AC
  Administered 2022-05-10: 12.5 g via INTRAVENOUS

## 2022-05-10 MED ORDER — POLYETHYLENE GLYCOL 3350 17 G PO PACK: 17.0000 g | PACK | Freq: Every day | ORAL | Status: DC | PRN

## 2022-05-10 MED ORDER — DEXTROSE IN LACTATED RINGERS 5 % IV SOLN: INTRAVENOUS | Status: DC

## 2022-05-10 MED ORDER — SODIUM CHLORIDE 0.9 % IV SOLN
2.0000 g | Freq: Once | INTRAVENOUS | Status: AC
  Administered 2022-05-10: 2 g via INTRAVENOUS
  Filled 2022-05-10: qty 20

## 2022-05-10 MED ORDER — THIAMINE MONONITRATE 100 MG PO TABS
100.0000 mg | ORAL_TABLET | Freq: Every day | ORAL | Status: DC
  Administered 2022-05-11: 100 mg
  Filled 2022-05-10: qty 1

## 2022-05-10 MED ORDER — DEXMEDETOMIDINE HCL IN NACL 400 MCG/100ML IV SOLN
0.4000 ug/kg/h | INTRAVENOUS | Status: DC
  Administered 2022-05-10 (×2): 0.4 ug/kg/h via INTRAVENOUS
  Filled 2022-05-10 (×3): qty 100

## 2022-05-10 MED ORDER — FOLIC ACID 1 MG PO TABS
1.0000 mg | ORAL_TABLET | Freq: Every day | ORAL | Status: DC
  Administered 2022-05-11: 1 mg
  Filled 2022-05-10: qty 1

## 2022-05-10 MED ORDER — PROPOFOL 1000 MG/100ML IV EMUL
INTRAVENOUS | Status: AC
  Filled 2022-05-10: qty 100

## 2022-05-10 MED ORDER — ENOXAPARIN SODIUM 40 MG/0.4ML IJ SOSY
40.0000 mg | PREFILLED_SYRINGE | INTRAMUSCULAR | Status: DC
  Administered 2022-05-10 – 2022-05-11 (×2): 40 mg via SUBCUTANEOUS
  Filled 2022-05-10 (×2): qty 0.4

## 2022-05-10 MED ORDER — PANTOPRAZOLE 2 MG/ML SUSPENSION
40.0000 mg | Freq: Every day | ORAL | Status: DC
  Administered 2022-05-10 – 2022-05-11 (×2): 40 mg
  Filled 2022-05-10 (×2): qty 20

## 2022-05-10 MED ORDER — DOCUSATE SODIUM 100 MG PO CAPS: 100.0000 mg | ORAL_CAPSULE | Freq: Two times a day (BID) | ORAL | Status: DC | PRN

## 2022-05-10 NOTE — ED Notes (Signed)
Pt's pupils R & L round, 4 with brisk rxn

## 2022-05-10 NOTE — ED Notes (Signed)
Pt appears to be posturing. Edp determined pt to be intubated 2306 20 etomidate given 2307 100 Roccuronium given 2308 Pt intubated 7.5 ett; 26 at the teeth  Bilateral respiration auscultated by EDP 2314 Propofol drip started at 10 2316 Pt pupils R and L 6 and sluggish 2327 Pt at CT

## 2022-05-10 NOTE — ED Notes (Signed)
Arrived on shift to pt with active foley. Epic advised there was not an active foley order. RN messaged MD to obtain order for foley.

## 2022-05-10 NOTE — Progress Notes (Signed)
Pt received from [facility] and placed on ventilator in 3M04 at this time.

## 2022-05-10 NOTE — H&P (Signed)
NAMEKarlton Ward, MRN:  [id number], DOB:  February 11, 1971, LOS: 0 ADMISSION DATE:  05/09/2022, CONSULTATION DATE:  05/10/2022 REFERRING MD: [facility] , CHIEF COMPLAINT:  Acute Overdose, Delirium    History of Present Illness:  51 year old male with PMH HTN, Cocaine, ETOH abuse, Deaf and blindness. Presents to [facility] ED on 10/16 with reported drug overdose. Per family patient was doing cocaine, then found minimally responsive, EMS arrived and given Narcan without response. On arrival to ED remained unresponsive with weak cough. While in ED became combative, question if protecting airway. Intubated for further work-up could be completed. CT head negative. UDS positive for Cocaine, ETOH 55. Transferred to [facility] for further work-up   Pertinent  Medical History  HTN, Cocaine, ETOH, Deaf, [place] [facility] Events: Including procedures, antibiotic start and stop dates in addition to other pertinent events   10/16 Presents to [facility] ED. Intubated   Interim History / Subjective:  Arrives to ICU unresponsive on Propofol gtt.   Objective   Blood pressure (!) 143/99, pulse (!) 57, temperature (!) 97.5 F (36.4 C), resp. rate 14, height 6\' 1"  (1.854 m), weight 73 kg, SpO2 100 %.    Vent Mode: PRVC FiO2 (%):  [40 %-100 %] 40 % Set Rate:  [16 bmp] 16 bmp Vt Set:  [640 mL] 640 mL PEEP:  [5 cmH20] 5 cmH20 Plateau Pressure:  [15 cmH20-18 cmH20] 18 cmH20   Intake/Output Summary (Last 24 hours) at 05/10/2022 5397 Last data filed at 05/10/2022 0516 Gross per 24 hour  Intake 1000 ml  Output 750 ml  Net 250 ml   Filed Weights   05/09/22 2346  Weight: 73 kg    Examination: General: Adult male, lying in bed, on vent  HENT: ETT/OG in place  Lungs: Clear breath sounds, white frothy secretions noted  Cardiovascular: RRR, HR 69, no mRG Abdomen: soft, non-distended, active bowel sounds  Extremities: -edema Neuro: +cough/gag, withdrawals in all extremities  GU: intact   Resolved Hospital  Problem list     Assessment & Plan:   Acute Encephalopathy in setting of acute overdose with cocaine and suspected opiate  H/O Polysubstance and ETOH  Plan - D/C Propofol. If needed use Precedex gtt  - Folic, Thiamine, MV  Respiratory Insufficieny in setting of above.  Plan - D/C propofol with hopes of vent weaning  - Goal extubation  - VAP bundle in place   Hypokalemia  Plan - Replacing now   Hypoglycemia  Plan - Hypoglycemia protocol  - Will Start Dextrose LR for now   Best Practice (right click and "Reselect all SmartList Selections" daily)   Diet/type: NPO. Will re-evaluate in a couple of hours, if unable to extubate will start TF   DVT prophylaxis: LMWH GI prophylaxis: PPI Lines: N/A Foley:  N/A Code Status:  full code Last date of multidisciplinary goals of care discussion Aletha Halim update family when arrive]  Labs   CBC: Recent Labs  Lab 05/09/22 2237  WBC 8.0  NEUTROABS 4.9  HGB 13.6  HCT 41.5  MCV 89.2  PLT 673    Basic Metabolic Panel: Recent Labs  Lab 05/10/22 0011  NA 139  K 3.4*  CL 102  CO2 21*  GLUCOSE 148*  BUN 13  CREATININE 0.93  CALCIUM 8.6*   GFR: Estimated Creatinine Clearance: 99.2 mL/min (by C-G formula based on SCr of 0.93 mg/dL). Recent Labs  Lab 05/09/22 2237 05/10/22 0358  WBC 8.0  --   LATICACIDVEN  --  1.6    Liver Function Tests: Recent Labs  Lab 05/10/22 0011  AST 37  ALT 19  ALKPHOS 64  BILITOT 0.6  PROT 7.8  ALBUMIN 4.4   No results for input(s): "LIPASE", "AMYLASE" in the last 168 hours. No results for input(s): "AMMONIA" in the last 168 hours.  ABG    Component Value Date/Time   PHART 7.45 05/10/2022 0002   PCO2ART 28 (L) 05/10/2022 0002   PO2ART 465 (H) 05/10/2022 0002   HCO3 20.3 05/10/2022 0002   ACIDBASEDEF 3.4 (H) 05/10/2022 0002   O2SAT 100 05/10/2022 0002     Coagulation Profile: No results for input(s): "INR", "PROTIME" in the last 168 hours.  Cardiac Enzymes: No results for  input(s): "CKTOTAL", "CKMB", "CKMBINDEX", "TROPONINI" in the last 168 hours.  HbA1C: No results found for: "HGBA1C"  CBG: Recent Labs  Lab 05/10/22 0821 05/10/22 0943  GLUCAP 70 55*    Review of Systems:   Unable to review   Past Medical History:  He,  has a past medical history of Deaf.   Surgical History:   Past Surgical History:  Procedure Laterality Date   EYE SURGERY       Social History:   reports that he has been smoking cigarettes. He has never used smokeless tobacco. He reports current alcohol use. He reports current drug use. Drug: Cocaine.   Family History:  His family history is not on file.   Allergies Allergies  Allergen Reactions   Penicillins Other (See Comments)    Unknown     Home Medications  Prior to Admission medications   Not on File     Critical care time: 55 minutes     Jovita Kussmaul, AGACNP-BC [facility]  PCCM Pgr: [phone]

## 2022-05-10 NOTE — ED Notes (Signed)
Pt placed on bear hugger.  

## 2022-05-10 NOTE — ED Notes (Signed)
[facility] has left with pt. 

## 2022-05-10 NOTE — ED Notes (Signed)
EDP made aware of pt's BP. Orders are to down the propofol drip to 22ml/hr

## 2022-05-10 NOTE — ED Notes (Signed)
Receiving unit has not called to obtain report on pt.

## 2022-05-10 NOTE — ED Notes (Signed)
Report given to receiving unit and care was accepted by receiving RN.

## 2022-05-10 NOTE — Progress Notes (Signed)
Wallet at bedside with credit cards in it [place] [facility] is taking wallet to security for storage.

## 2022-05-10 NOTE — ED Notes (Signed)
EDP made aware of pt's BP. Orders: Change propofol to 5, give fluids. See mar

## 2022-05-10 NOTE — ED Notes (Signed)
Brother called and was updated

## 2022-05-10 NOTE — ED Notes (Signed)
Pt's pupils R and L round, 2. Fixed

## 2022-05-10 NOTE — ED Notes (Signed)
Pt was waking up and biting tube.

## 2022-05-10 NOTE — ED Notes (Signed)
[facility] has arrived. Report was previously given

## 2022-05-10 NOTE — ED Notes (Signed)
Attempted to call report to receiving unit. Call answered. Receiving unit reported that receiving RN had not arrived to work. Receiving RN to call back

## 2022-05-11 DIAGNOSIS — J9601 Acute respiratory failure with hypoxia: Secondary | ICD-10-CM | POA: Diagnosis not present

## 2022-05-11 DIAGNOSIS — T50904D Poisoning by unspecified drugs, medicaments and biological substances, undetermined, subsequent encounter: Secondary | ICD-10-CM | POA: Diagnosis not present

## 2022-05-11 DIAGNOSIS — Z9911 Dependence on respirator [ventilator] status: Secondary | ICD-10-CM | POA: Diagnosis not present

## 2022-05-11 DIAGNOSIS — F1410 Cocaine abuse, uncomplicated: Secondary | ICD-10-CM | POA: Diagnosis not present

## 2022-05-11 LAB — CBC
HCT: 35.9 % — ABNORMAL LOW (ref 39.0–52.0)
Hemoglobin: 12.4 g/dL — ABNORMAL LOW (ref 13.0–17.0)
MCH: 29.4 pg (ref 26.0–34.0)
MCHC: 34.5 g/dL (ref 30.0–36.0)
MCV: 85.1 fL (ref 80.0–100.0)
Platelets: 175 10*3/uL (ref 150–400)
RBC: 4.22 MIL/uL (ref 4.22–5.81)
RDW: 13.2 % (ref 11.5–15.5)
WBC: 8.5 10*3/uL (ref 4.0–10.5)
nRBC: 0 % (ref 0.0–0.2)

## 2022-05-11 LAB — GLUCOSE, CAPILLARY
Glucose-Capillary: 144 mg/dL — ABNORMAL HIGH (ref 70–99)
Glucose-Capillary: 114 mg/dL — ABNORMAL HIGH (ref 70–99)
Glucose-Capillary: 100 mg/dL — ABNORMAL HIGH (ref 70–99)
Glucose-Capillary: 145 mg/dL — ABNORMAL HIGH (ref 70–99)
Glucose-Capillary: 92 mg/dL (ref 70–99)
Glucose-Capillary: 105 mg/dL — ABNORMAL HIGH (ref 70–99)

## 2022-05-11 LAB — BASIC METABOLIC PANEL
Anion gap: 9 (ref 5–15)
BUN: 11 mg/dL (ref 6–20)
CO2: 22 mmol/L (ref 22–32)
Calcium: 9 mg/dL (ref 8.9–10.3)
Chloride: 106 mmol/L (ref 98–111)
Creatinine, Ser: 0.83 mg/dL (ref 0.61–1.24)
GFR, Estimated: >60 mL/min (ref >60)
Glucose, Bld: 139 mg/dL — ABNORMAL HIGH (ref 70–99)
Potassium: 3.8 mmol/L (ref 3.5–5.1)
Sodium: 137 mmol/L (ref 135–145)

## 2022-05-11 LAB — PHOSPHORUS: Phosphorus: 3.2 mg/dL (ref 2.5–4.6)

## 2022-05-11 LAB — MAGNESIUM: Magnesium: 2 mg/dL (ref 1.7–2.4)

## 2022-05-11 MED ORDER — SODIUM CHLORIDE 0.9 % IV SOLN
2.0000 g | INTRAVENOUS | Status: DC
  Administered 2022-05-11 – 2022-05-12 (×2): 2 g via INTRAVENOUS
  Filled 2022-05-11 (×2): qty 20

## 2022-05-11 MED ORDER — POTASSIUM CHLORIDE 10 MEQ/100ML IV SOLN
10.0000 meq | INTRAVENOUS | Status: AC
  Administered 2022-05-11 (×2): 10 meq via INTRAVENOUS
  Filled 2022-05-11 (×2): qty 100

## 2022-05-11 NOTE — Progress Notes (Signed)
Interpretation Services Dispatch: [phone]. Physical Interpreter needed.

## 2022-05-11 NOTE — Procedures (Signed)
Extubation Procedure Note  Patient Details:   Name: Wayne Ward DOB: 05-25-1971 MRN: [id number]   Airway Documentation:  Airway 26 mm (Active)  Secured at (cm) 26 cm 05/11/22 0736  Measured From Lips 05/11/22 0736  Secured Location Left 05/11/22 0736  Secured By [employer] 05/11/22 0736  Tube Holder Repositioned Yes 05/11/22 0736  Prone position No 05/11/22 0736  Cuff Pressure (cm H2O) Clear OR 27-39 CmH2O 05/11/22 0736  Site Condition Dry 05/11/22 0736   Vent end date: (not recorded) Vent end time: (not recorded)   Evaluation  O2 sats: stable throughout Complications: No apparent complications Patient did tolerate procedure well. Bilateral Breath Sounds: Clear   Yes RT extubated pt per MD order to 4L [place]. Pt tolerated procedure well. No complications noted.  Patsy [place] Amilyah Nack 05/11/2022, 9:05 AM

## 2022-05-11 NOTE — Progress Notes (Signed)
Respiratory culture collected by RT and sent to main lab at this time.

## 2022-05-11 NOTE — Evaluation (Signed)
Physical Therapy Evaluation Patient Details Name: Wayne Ward MRN: [id number] DOB: 1971/06/09 Today's Date: 05/11/2022  History of Present Illness  51 Y/O with PMH of HTN, cocaine, Etoh, presenting with AMS after doing cocaine.Did not respond to narcan. Intubated in ED and transferred to [facility] from [facility].  Clinical Impression  Patient presents with decreased activity tolerance, decreased balance and safety awareness and currently minguard for up to recliner today.  Feel he will progress during acute stay and likely not need follow up PT.  Will follow along acutely.        Recommendations for follow up therapy are one component of a multi-disciplinary discharge planning process, led by the attending physician.  Recommendations may be updated based on patient status, additional functional criteria and insurance authorization.  Follow Up Recommendations No PT follow up      Assistance Recommended at Discharge Intermittent Supervision/Assistance  Patient can return home with the following  Assistance with cooking/housework;Assist for transportation    Equipment Recommendations None recommended by PT  Recommendations for Other Services       Functional Status Assessment Patient has had a recent decline in their functional status and demonstrates the ability to make significant improvements in function in a reasonable and predictable amount of time.     Precautions / Restrictions Precautions Precautions: Fall Precaution Comments: low vision, deaf      Mobility  Bed Mobility Overal bed mobility: Needs Assistance Bed Mobility: Supine to Sit     Supine to sit: Supervision, HOB elevated     General bed mobility comments: assist for lines    Transfers Overall transfer level: Needs assistance   Transfers: Sit to/from Stand, Bed to chair/wheelchair/BSC Sit to Stand: Min guard   Step pivot transfers: Min guard       General transfer comment: assist for lines and  safety, on 4L O2 but removed for transfer due to wrapping up in lines, remained with SpO2 WNL    Ambulation/Gait               General Gait Details: NT, extubated earlier this am  Stairs            Wheelchair Mobility    Modified Rankin (Stroke Patients Only)       Balance Overall balance assessment: Needs assistance   Sitting balance-Leahy Scale: Good       Standing balance-Leahy Scale: Fair Standing balance comment: able to stand without LOB and no UE support                             Pertinent Vitals/Pain Pain Assessment Pain Assessment: No/denies pain    Home Living Family/patient expects to be discharged to:: Private residence Living Arrangements: Other relatives (brother) Available Help at Discharge: Family;Available PRN/intermittently Type of Home: Mobile home Home Access: Stairs to enter   Entrance Stairs-Number of Steps: 4   Home Layout: One level        Prior Function Prior Level of Function : Independent/Modified Independent             Mobility Comments: 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[employer], physically demanding job       Hand Dominance   Dominant Hand: Right    Extremity/Trunk Assessment   Upper Extremity Assessment Upper Extremity Assessment: Overall WFL for tasks assessed    Lower Extremity Assessment Lower Extremity Assessment: Overall WFL for tasks assessed    Cervical / Trunk Assessment Cervical /  Trunk Assessment: Normal  Communication   Communication: Interpreter utilized;Deaf (and used written communication)  Cognition Arousal/Alertness: Awake/alert Behavior During Therapy: WFL for tasks assessed/performed, Impulsive Overall Cognitive Status: Impaired/Different from baseline Area of Impairment: Orientation, Safety/judgement                 Orientation Level: Disoriented to, Place       Safety/Judgement: Decreased awareness of safety     General Comments: alert but did not remember  for sure if he was still in [place] or not        General Comments General comments (skin integrity, edema, etc.): Wearing thick glasses with area of increased magnification in the middle.  Struggled to read writing on paper in pencil, better with black marker.  In person ASL interpreter arrived  near end of session and pt communicated well with writing and through sign language    Exercises     Assessment/Plan    PT Assessment Patient needs continued PT services  PT Problem List Decreased mobility;Decreased safety awareness;Decreased activity tolerance       PT Treatment Interventions DME instruction;Therapeutic activities;Patient/family education;Therapeutic exercise;Gait training;Stair training;Functional mobility training    PT Goals (Current goals can be found in the Care Plan section)  Acute Rehab PT Goals Patient Stated Goal: to return to work PT Goal Formulation: With patient Time For Goal Achievement: 06/01/22 Potential to Achieve Goals: Good    Frequency Min 3X/week     Co-evaluation               AM-PAC PT "6 Clicks" Mobility  Outcome Measure Help needed turning from your back to your side while in a flat bed without using bedrails?: A Little Help needed moving from lying on your back to sitting on the side of a flat bed without using bedrails?: A Little Help needed moving to and from a bed to a chair (including a wheelchair)?: A Little Help needed standing up from a chair using your arms (e.g., wheelchair or bedside chair)?: A Little Help needed to walk in hospital room?: Total Help needed climbing 3-5 steps with a railing? : Total 6 Click Score: 14    End of Session Equipment Utilized During Treatment: Gait belt Activity Tolerance: Patient tolerated treatment well Patient left: in chair;with call bell/phone within reach   PT Visit Diagnosis: Other abnormalities of gait and mobility (R26.89)    Time: 7939-0300 PT Time Calculation (min) (ACUTE  ONLY): 25 min   Charges:   PT Evaluation $PT Eval Moderate Complexity: 1 Mod PT Treatments $Therapeutic Activity: 8-22 mins        Magda Kiel, PT Acute Rehabilitation Services Office:[phone] 05/11/2022   Reginia Naas 05/11/2022, 3:23 PM

## 2022-05-11 NOTE — Progress Notes (Signed)
NAMEGerik Ward, MRN:  [id number], DOB:  1971/01/17, LOS: 1 ADMISSION DATE:  05/09/2022, CONSULTATION DATE:  05/10/2022 REFERRING MD: [facility] , CHIEF COMPLAINT:  Acute Overdose, Delirium    History of Present Illness:  51 year old male with PMH HTN, Cocaine, ETOH abuse, Deaf and legally blind. Presents to [facility] ED on 10/16 with reported drug overdose. Per family patient was doing cocaine, then found minimally responsive, EMS arrived and given Narcan without response. On arrival to ED remained unresponsive with weak cough. While in ED became combative, question if protecting airway. Intubated for further work-up could be completed. CT head negative. UDS positive for Cocaine, ETOH 55. Transferred to [facility] for further work-up   Pertinent  Medical History  HTN, Cocaine, ETOH, Deaf, Legally blind   Significant Hospital Events: Including procedures, antibiotic start and stop dates in addition to other pertinent events   10/16 Presents to [facility] ED. Intubated   Interim History / Subjective:  Arrives to ICU unresponsive on Propofol gtt.   Objective   Blood pressure (!) 130/99, pulse (!) 49, temperature 98 F (36.7 C), temperature source Oral, resp. rate 16, height 6\' 1"  (1.854 m), weight 73 kg, SpO2 99 %.    Vent Mode: PRVC FiO2 (%):  [30 %-40 %] 30 % Set Rate:  [16 bmp] 16 bmp Vt Set:  [640 mL] 640 mL PEEP:  [5 cmH20] 5 cmH20 Plateau Pressure:  [17 cmH20-20 cmH20] 18 cmH20   Intake/Output Summary (Last 24 hours) at 05/11/2022 0744 Last data filed at 05/11/2022 0600 Gross per 24 hour  Intake 2399.58 ml  Output 850 ml  Net 1549.58 ml   Filed Weights   05/09/22 2346  Weight: 73 kg    Examination: General: Adult male, lying in bed, on vent  HENT: ETT/OG in place  Lungs: Clear breath sounds Cardiovascular: Brady, HR 49, no mRG Abdomen: soft, non-distended, active bowel sounds  Extremities: -edema Neuro: +cough/gag, withdrawals in all extremities, awakens with physical  stimulation  GU: intact   Resolved Hospital Problem list     Assessment & Plan:   Acute Encephalopathy in setting of acute overdose with cocaine and suspected opiate  H/O Polysubstance and ETOH  Plan - Titrate Precedex gtt for RASS goal 0 - Folic, Thiamine, MV  Respiratory Insufficieny in setting of above.  Plan - Continue Vent Support > Apnea this AM. Hold Precedex  - Goal extubation today  - VAP bundle in place   Hypokalemia > resolved  Plan - Trend BMP - Replace as indicated   Hypoglycemia > improved  Plan - Hypoglycemia protocol  - Continue Dextrose LR for now   Best Practice (right click and "Reselect all SmartList Selections" daily)   Diet/type: NPO. If unable to extubate will start TF   DVT prophylaxis: LMWH GI prophylaxis: PPI Lines: N/A Foley:  Yes, and it is no longer needed. D/C Today  Code Status:  full code Last date of multidisciplinary goals of care discussion 05/11/22 update family when arrive, brother updated at bedside 10/17]  Labs   CBC: Recent Labs  Lab 05/09/22 2237 05/10/22 1310 05/11/22 0153  WBC 8.0  --  8.5  NEUTROABS 4.9  --   --   HGB 13.6 13.3 12.4*  HCT 41.5 39.0 35.9*  MCV 89.2  --  85.1  PLT 235  --  175    Basic Metabolic Panel: Recent Labs  Lab 05/10/22 0011 05/10/22 1020 05/10/22 1310 05/11/22 0153  NA 139  --  136 137  K 3.4*  --  3.8 3.8  CL 102  --   --  106  CO2 21*  --   --  22  GLUCOSE 148*  --   --  139*  BUN 13  --   --  11  CREATININE 0.93  --   --  0.83  CALCIUM 8.6*  --   --  9.0  MG  --  1.5*  --  2.0  PHOS  --  2.3*  --  3.2   GFR: Estimated Creatinine Clearance: 111.2 mL/min (by C-G formula based on SCr of 0.83 mg/dL). Recent Labs  Lab 05/09/22 2237 05/10/22 0358 05/11/22 0153  WBC 8.0  --  8.5  LATICACIDVEN  --  1.6  --     Liver Function Tests: Recent Labs  Lab 05/10/22 0011  AST 37  ALT 19  ALKPHOS 64  BILITOT 0.6  PROT 7.8  ALBUMIN 4.4   No results for input(s): "LIPASE",  "AMYLASE" in the last 168 hours. No results for input(s): "AMMONIA" in the last 168 hours.  ABG    Component Value Date/Time   PHART 7.449 05/10/2022 1310   PCO2ART 34.2 05/10/2022 1310   PO2ART 171 (H) 05/10/2022 1310   HCO3 24.0 05/10/2022 1310   TCO2 25 05/10/2022 1310   ACIDBASEDEF 3.4 (H) 05/10/2022 0002   O2SAT 100 05/10/2022 1310     Coagulation Profile: No results for input(s): "INR", "PROTIME" in the last 168 hours.  Cardiac Enzymes: No results for input(s): "CKTOTAL", "CKMB", "CKMBINDEX", "TROPONINI" in the last 168 hours.  HbA1C: No results found for: "HGBA1C"  CBG: Recent Labs  Lab 05/10/22 1156 05/10/22 1619 05/10/22 1904 05/10/22 2316 05/11/22 0303  GLUCAP 99 124* 118* 140* 144*    Review of Systems:   Unable to review   Past Medical History:  He,  has a past medical history of Deaf.   Surgical History:   Past Surgical History:  Procedure Laterality Date   EYE SURGERY       Social History:   reports that he has been smoking cigarettes. He has never used smokeless tobacco. He reports current alcohol use. He reports current drug use. Drug: Cocaine.   Family History:  His family history is not on file.   Allergies Allergies  Allergen Reactions   Penicillins Other (See Comments)    Unknown     Home Medications  Prior to Admission medications   Not on File     Critical care time: 45 minutes     Wayne Ward, AGACNP-BC [place] Pgr: [phone]

## 2022-05-12 DIAGNOSIS — J690 Pneumonitis due to inhalation of food and vomit: Secondary | ICD-10-CM

## 2022-05-12 DIAGNOSIS — F1410 Cocaine abuse, uncomplicated: Secondary | ICD-10-CM | POA: Diagnosis not present

## 2022-05-12 DIAGNOSIS — T50904D Poisoning by unspecified drugs, medicaments and biological substances, undetermined, subsequent encounter: Secondary | ICD-10-CM | POA: Diagnosis not present

## 2022-05-12 LAB — CBC
HCT: 34.9 % — ABNORMAL LOW (ref 39.0–52.0)
Hemoglobin: 12 g/dL — ABNORMAL LOW (ref 13.0–17.0)
MCH: 29.6 pg (ref 26.0–34.0)
MCHC: 34.4 g/dL (ref 30.0–36.0)
MCV: 86.2 fL (ref 80.0–100.0)
Platelets: 160 10*3/uL (ref 150–400)
RBC: 4.05 MIL/uL — ABNORMAL LOW (ref 4.22–5.81)
RDW: 13.2 % (ref 11.5–15.5)
WBC: 7.5 10*3/uL (ref 4.0–10.5)
nRBC: 0 % (ref 0.0–0.2)

## 2022-05-12 LAB — PHOSPHORUS: Phosphorus: 3.5 mg/dL (ref 2.5–4.6)

## 2022-05-12 LAB — MAGNESIUM: Magnesium: 1.8 mg/dL (ref 1.7–2.4)

## 2022-05-12 LAB — BASIC METABOLIC PANEL
Anion gap: 9 (ref 5–15)
BUN: 17 mg/dL (ref 6–20)
CO2: 24 mmol/L (ref 22–32)
Calcium: 8.4 mg/dL — ABNORMAL LOW (ref 8.9–10.3)
Chloride: 104 mmol/L (ref 98–111)
Creatinine, Ser: 1.12 mg/dL (ref 0.61–1.24)
GFR, Estimated: >60 mL/min (ref >60)
Glucose, Bld: 110 mg/dL — ABNORMAL HIGH (ref 70–99)
Potassium: 4.1 mmol/L (ref 3.5–5.1)
Sodium: 137 mmol/L (ref 135–145)

## 2022-05-12 LAB — GLUCOSE, CAPILLARY
Glucose-Capillary: 87 mg/dL (ref 70–99)
Glucose-Capillary: 95 mg/dL (ref 70–99)
Glucose-Capillary: 90 mg/dL (ref 70–99)

## 2022-05-12 MED ORDER — DOXYCYCLINE HYCLATE 50 MG PO CAPS: 50.0000 mg | ORAL_CAPSULE | Freq: Two times a day (BID) | ORAL | 0 refills | Status: DC

## 2022-05-12 MED ORDER — DOXYCYCLINE HYCLATE 50 MG PO CAPS: 100.0000 mg | ORAL_CAPSULE | Freq: Two times a day (BID) | ORAL | 0 refills | Status: DC

## 2022-05-12 NOTE — Progress Notes (Signed)
Patient wallet returned to patient from security. RN reviewed contents of wallet with patient and pt's brother at bedside. Pt. confirmed that all contents were present and accounted for.

## 2022-05-12 NOTE — TOC Transition Note (Signed)
Transition of Care [facility]) - CM/SW Discharge Note   Patient Details  Name: Yovanny Coats MRN: [id number] Date of Birth: 05/24/1971  [facility]) CM/SW Contact:  Tom-Johnson, Renea Ee, RN Phone Number: 05/12/2022, 12:16 PM   Clinical Narrative:     Patient is scheduled for discharge today. Spoke with patient and brother, Ricci Barker at bedside with the aide of Clyde Canterbury, with Interpreter services.  From home with Chavis and he will transport patient home.  Hosp f/u scheduled with Internal Medicine, info on AVS.   No PT/OT f/u noted. Denies any needs, awaiting for his wallet from K[place].  No further TOC needs noted.   Final next level of care: Home/Self Care Barriers to Discharge: Barriers Resolved   Patient Goals and CMS Choice Patient states their goals for this hospitalization and ongoing recovery are:: To return home CMS Medicare.gov Compare Post Acute Care list provided to:: Patient Choice offered to / list presented to : Patient, Sibling (Brother, Chavis)  Discharge Placement                Patient to be transferred to facility by: Brother Name of family member notified: Ricci Barker, brother    Discharge Plan and Services                DME Arranged: N/A DME Agency: NA       HH Arranged: NA HH Agency: NA        Social Determinants of Health (SDOH) Interventions     Readmission Risk Interventions     No data to display

## 2022-05-12 NOTE — Progress Notes (Signed)
Physical Therapy Treatment Patient Details Name: Wayne Ward MRN: [id number] DOB: 02-21-1971 Today's Date: 05/12/2022   History of Present Illness 51 Y/O with PMH of HTN, cocaine, Etoh, presenting with AMS after doing cocaine.Did not respond to narcan. Intubated in ED and transferred to [facility] from [facility], ED.    PT Comments    Pt admitted with above diagnosis. Pt was able to progress ambulation and is most likely close to baseline with no LOB with challenges to balance as well . Pt can ascend and descend steps without assist as well. Pt will not need f/u or equipment. Will follow acutely until pt discharges to ensure movement.  Pt currently with functional limitations due to balance and endurance deficits. Pt will benefit from skilled PT to increase their independence and safety with mobility to allow discharge to the venue listed below.      Recommendations for follow up therapy are one component of a multi-disciplinary discharge planning process, led by the attending physician.  Recommendations may be updated based on patient status, additional functional criteria and insurance authorization.  Follow Up Recommendations  No PT follow up     Assistance Recommended at Discharge Intermittent Supervision/Assistance  Patient can return home with the following Assistance with cooking/housework;Assist for transportation   Equipment Recommendations  None recommended by PT    Recommendations for Other Services       Precautions / Restrictions Precautions Precautions: Fall Precaution Comments: low vision, deaf Restrictions Weight Bearing Restrictions: No     Mobility  Bed Mobility               General bed mobility comments: in chair on arrrival    Transfers Overall transfer level: Needs assistance Equipment used: None Transfers: Sit to/from Stand, Bed to chair/wheelchair/BSC Sit to Stand: Supervision   Step pivot transfers: Supervision       General transfer  comment: pt is impulsive with all mobility  but did not have LOB    Ambulation/Gait Ambulation/Gait assistance: Supervision Gait Distance (Feet): 600 Feet Assistive device: None Gait Pattern/deviations: WFL(Within Functional Limits)   Gait velocity interpretation: 1.31 - 2.62 ft/sec, indicative of limited community ambulator   General Gait Details: Pt was able to incr distance with ambulation with no LOB and overall good safety. Walks quickly at time but is safe overall even with mod challenges.   Stairs Stairs: Yes Stairs assistance: Supervision Stair Management: One rail Right, Alternating pattern, Forwards Number of Stairs: 7 General stair comments: Pt able to ascend and descend steps without assist.   Wheelchair Mobility    Modified Rankin (Stroke Patients Only)       Balance Overall balance assessment: Needs assistance Sitting-balance support: Feet supported Sitting balance-Leahy Scale: Normal     Standing balance support: No upper extremity supported Standing balance-Leahy Scale: Fair Standing balance comment: able to stand without LOB and no UE support                 Standardized Balance Assessment Standardized Balance Assessment : Dynamic Gait Index   Dynamic Gait Index Level Surface: Normal Change in Gait Speed: Normal Gait with Horizontal Head Turns: Mild Impairment Gait with Vertical Head Turns: Mild Impairment Gait and Pivot Turn: Normal Step Over Obstacle: Normal Step Around Obstacles: Normal Steps: Mild Impairment Total Score: 21      Cognition Arousal/Alertness: Awake/alert Behavior During Therapy: WFL for tasks assessed/performed, Impulsive Overall Cognitive Status: Impaired/Different from baseline Area of Impairment: Safety/judgement  Safety/Judgement: Decreased awareness of safety     General Comments: appears that pt has an impulsive/unsafe lifestyle PTA so do not feel this may be new from his  current situation.        Exercises      General Comments General comments (skin integrity, edema, etc.): Pt appears to be very close to baseline      Pertinent Vitals/Pain Pain Assessment Pain Assessment: No/denies pain    Home Living Family/patient expects to be discharged to:: Private residence Living Arrangements: Other relatives (Lives with brother) Available Help at Discharge: Family;Available PRN/intermittently Type of Home: Mobile home Home Access: Stairs to enter   Entrance Stairs-Number of Steps: 4   Home Layout: One level Home Equipment: None      Prior Function            PT Goals (current goals can now be found in the care plan section) Acute Rehab PT Goals Patient Stated Goal: to return to work Progress towards PT goals: Progressing toward goals    Frequency    Min 3X/week      PT Plan Current plan remains appropriate    Co-evaluation              AM-PAC PT "6 Clicks" Mobility   Outcome Measure  Help needed turning from your back to your side while in a flat bed without using bedrails?: None Help needed moving from lying on your back to sitting on the side of a flat bed without using bedrails?: None Help needed moving to and from a bed to a chair (including a wheelchair)?: A Little Help needed standing up from a chair using your arms (e.g., wheelchair or bedside chair)?: A Little Help needed to walk in hospital room?: A Little Help needed climbing 3-5 steps with a railing? : A Little 6 Click Score: 20    End of Session Equipment Utilized During Treatment: Gait belt Activity Tolerance: Patient tolerated treatment well Patient left: in chair;with call bell/phone within reach;with family/visitor present;with chair alarm set Nurse Communication: Mobility status PT Visit Diagnosis: Other abnormalities of gait and mobility (R26.89)     Time: 4818-5631 PT Time Calculation (min) (ACUTE ONLY): 19 min  Charges:  $Gait Training: 8-22  mins                     Alaiya Martindelcampo M,PT Acute Rehab Services [place] 05/12/2022, 11:20 AM

## 2022-05-12 NOTE — Discharge Summary (Signed)
Discharge Summary  Patient ID: Wayne Ward MRN: [id number] DOB/AGE: 49-Jun-1972 49 y.o.  Admit date: 05/09/2022 Discharge date: 05/12/2022  Admission Diagnoses:  Discharge Diagnoses:  Principal Problem:   Drug overdose   Discharged Condition: good  Hospital Course: 51 year old male with PMH HTN, Cocaine, ETOH abuse, Deaf and legally blind. Presents to [facility] ED on 10/16 with reported drug overdose. Per family patient was doing cocaine, then found minimally responsive, EMS arrived and given Narcan without response. On arrival to ED remained unresponsive with weak cough. While in ED became combative, question if protecting airway. Intubated for further work-up could be completed. CT head negative. UDS positive for Cocaine, ETOH 55. Transferred to [facility] for further work-up. Successfully extubated 77/41 with no complications. Treated with Rocephin for aspiration PNA. Stable for discharge 10/19. Prescribed Doxycyline for 5 days to complete treatment for aspiration PNA. Have advised patient not to drive given concern of poor vision until evaluated by PCP. Have sent a referral for transition of care for PCP needs.   Consults: None  Discharge Exam: Blood pressure (!) 146/95, pulse 76, temperature 98.9 F (37.2 C), temperature source Oral, resp. rate 14, height 6\' 1"  (1.854 m), weight 73 kg, SpO2 95 %. General appearance: alert, cooperative, and appears stated age Resp: clear to auscultation bilaterally Cardio: regular rate and rhythm, S1, S2 normal, no murmur, click, rub or gallop GI: soft, non-tender; bowel sounds normal; no masses,  no organomegaly Extremities: extremities normal, atraumatic, no cyanosis or edema Skin: Skin color, texture, turgor normal. No rashes or lesions Neuro: Alert, oriented, follows commands   Disposition: Discharge disposition: 01-Home or Self Care       Discharge Instructions     Increase activity slowly   Complete by: As directed       Allergies as  of 05/12/2022       Reactions   Penicillins Other (See Comments)   Unknown        Medication List     TAKE these medications    doxycycline 50 MG capsule Commonly known as: VIBRAMYCIN Take 1 capsule (50 mg total) by mouth 2 (two) times daily. Start taking on: May 13, 2022         Signed: Omar Person 05/12/2022, 9:13 AM

## 2022-05-12 NOTE — Evaluation (Signed)
Occupational Therapy Evaluation Patient Details Name: Wayne Ward MRN: [id number] DOB: 02/10/1971 Today's Date: 05/12/2022   History of Present Illness 51 Y/O with PMH of HTN, cocaine, Etoh, presenting with AMS after doing cocaine.Did not respond to narcan. Intubated in ED and transferred to [facility] from [facility], ED.   Clinical Impression   Pt admitted with the above diagnosis and overall is very close to his baseline.  Pt would benefit from 1 or 2 more sessions to improve safety with mobility during adls to avoid falls at home.  Spoke to pt about driving in regards to his vision. Pt sees almost nothing out of his R eye (just a pinhole) and sees some out of the L eye but therapist needed to write in dark marker and use very large letters for pt to read.  Pt states he has a valid driver's license?  This pt may benefit from a drivers evaluation.  Feel his vision may not be adequate for driving and pt does drive daily. Otherwise feel pt is safe to d/c home with his brother.       Recommendations for follow up therapy are one component of a multi-disciplinary discharge planning process, led by the attending physician.  Recommendations may be updated based on patient status, additional functional criteria and insurance authorization.   Follow Up Recommendations  No OT follow up    Assistance Recommended at Discharge Intermittent Supervision/Assistance  Patient can return home with the following A little help with bathing/dressing/bathroom;Help with stairs or ramp for entrance    Functional Status Assessment  Patient has had a recent decline in their functional status and demonstrates the ability to make significant improvements in function in a reasonable and predictable amount of time.  Equipment Recommendations  None recommended by OT    Recommendations for Other Services       Precautions / Restrictions Precautions Precautions: Fall Precaution Comments: low vision,  deaf Restrictions Weight Bearing Restrictions: No      Mobility Bed Mobility Overal bed mobility: Needs Assistance Bed Mobility: Supine to Sit, Sit to Supine     Supine to sit: Supervision, HOB elevated Sit to supine: Supervision   General bed mobility comments: assist for lines and impulsivity    Transfers Overall transfer level: Needs assistance Equipment used: None Transfers: Sit to/from Stand, Bed to chair/wheelchair/BSC Sit to Stand: Supervision     Step pivot transfers: Supervision     General transfer comment: pt is impulsive with all mobility in regards to lines but did not have LOB      Balance Overall balance assessment: Needs assistance Sitting-balance support: Feet supported Sitting balance-Leahy Scale: Normal     Standing balance support: No upper extremity supported Standing balance-Leahy Scale: Fair Standing balance comment: able to stand without LOB and no UE support                           ADL either performed or assessed with clinical judgement   ADL Overall ADL's : At baseline                                       General ADL Comments: Pt is very impulsive with mobilty in room but did not have LOB.  Pt states he drives and has a valid drivers license.  Unsure if this is correct.  Feel pts vision could preclude him  from driving and unsure how this has medically been addressed.     Vision Baseline Vision/History: 1 Wears glasses;2 Legally blind (legally blind R eye) Ability to See in Adequate Light: 1 Impaired Patient Visual Report: No change from baseline Vision Assessment?: Yes Eye Alignment: Impaired (comment) Ocular Range of Motion: Within Functional Limits Alignment/Gaze Preference: Within Defined Limits Tracking/Visual Pursuits: Impaired - to be further tested in functional context Convergence: Within functional limits Visual Fields: Right visual field deficit;Other (comment) (sees just a pinhole out of R  eye when isolated.  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 Has corrective lenses. States he sees very little outside of a cental pinhole in R eye but can see more out of L eye.     Perception     Praxis      Pertinent Vitals/Pain Pain Assessment Pain Assessment: No/denies pain     Hand Dominance Right   Extremity/Trunk Assessment Upper Extremity Assessment Upper Extremity Assessment: Overall WFL for tasks assessed   Lower Extremity Assessment Lower Extremity Assessment: Defer to PT evaluation   Cervical / Trunk Assessment Cervical / Trunk Assessment: Normal   Communication Communication Communication: Interpreter utilized;Deaf   Cognition Arousal/Alertness: Awake/alert Behavior During Therapy: WFL for tasks assessed/performed, Impulsive Overall Cognitive Status: Impaired/Different from baseline Area of Impairment: Safety/judgement                         Safety/Judgement: Decreased awareness of safety     General Comments: feel pt has an impulsive/unsafe lifestyle PTA so do not feel this may be new from his current situation.     General Comments  Pt appears to be very close to baseline with all adls    Exercises     Shoulder Instructions      Home Living Family/patient expects to be discharged to:: Private residence Living Arrangements: Other relatives Available Help at Discharge: Family;Available PRN/intermittently Type of Home: Mobile home Home Access: Stairs to enter Entrance Stairs-Number of Steps: 4   Home Layout: One level     Bathroom Shower/Tub: Chief Strategy Officer: Standard     Home Equipment: None          Prior Functioning/Environment Prior Level of Function : Independent/Modified Independent             Mobility Comments: 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  OT Frequency: Min 2X/week    Co-evaluation              AM-PAC OT "6 Clicks" Daily Activity     Outcome Measure Help from another person eating meals?: None Help from another person taking care of personal grooming?: None Help from another person toileting, which includes using toliet, bedpan, or urinal?: A Little Help from another person bathing (including washing, rinsing, drying)?: None Help from another person to put on and taking off regular upper body clothing?: None  Help from another person to put on and taking off regular lower body clothing?: A Little 6 Click Score: 22   End of Session Nurse Communication: Mobility status  Activity Tolerance: Patient tolerated treatment well Patient left: in chair;with call bell/phone within reach;with nursing/sitter in room  OT Visit Diagnosis: Unsteadiness on feet (R26.81)                Time: 8099-8338 OT Time Calculation (min): 33 min Charges:  OT General Charges $OT Visit: 1 Visit OT Evaluation $OT Eval Low Complexity: 1 Low OT Treatments $Self Care/Home Management : 8-22 mins  Hope Budds 05/12/2022, 8:57 AM

## 2022-05-14 LAB — CULTURE, RESPIRATORY W GRAM STAIN

## 2022-05-15 LAB — CULTURE, BLOOD (SINGLE)
Culture: NO GROWTH
Special Requests: ADEQUATE

## 2022-05-23 ENCOUNTER — Ambulatory Visit (INDEPENDENT_AMBULATORY_CARE_PROVIDER_SITE_OTHER): Payer: Medicare Other | Admitting: Student

## 2022-05-23 ENCOUNTER — Other Ambulatory Visit: Payer: Self-pay | Admitting: Student

## 2022-05-23 VITALS — BP 158/105 | HR 64 | Temp 98.0°F | Ht 73.0 in | Wt 157.2 lb

## 2022-05-23 DIAGNOSIS — Z7689 Persons encountering health services in other specified circumstances: Secondary | ICD-10-CM

## 2022-05-23 DIAGNOSIS — I10 Essential (primary) hypertension: Secondary | ICD-10-CM

## 2022-05-23 DIAGNOSIS — Z9189 Other specified personal risk factors, not elsewhere classified: Secondary | ICD-10-CM | POA: Diagnosis not present

## 2022-05-23 DIAGNOSIS — F17210 Nicotine dependence, cigarettes, uncomplicated: Secondary | ICD-10-CM

## 2022-05-23 DIAGNOSIS — I159 Secondary hypertension, unspecified: Secondary | ICD-10-CM

## 2022-05-23 MED ORDER — VALSARTAN 80 MG PO TABS: 80.0000 mg | ORAL_TABLET | Freq: Every day | ORAL | 0 refills | Status: DC

## 2022-05-23 NOTE — Patient Instructions (Signed)
Wayne Ward, it was a pleasure seeing you today!  Today we discussed: - I have started you on a medication called valsartan for your blood pressure. Take this once in the morning every day.   - We will try and have you follow-up at a primary care office in [place].   I have ordered the following medication/changed the following medications:   Start the following medications: Meds ordered this encounter  Medications   valsartan (DIOVAN) 80 MG tablet    Sig: Take 1 tablet (80 mg total) by mouth daily.    Dispense:  30 tablet    Refill:  0     Follow-up:  1 month    Please make sure to arrive 15 minutes prior to your next appointment. If you arrive late, you may be asked to reschedule.   We look forward to seeing you next time. Please call our clinic at [phone] if you have any questions or concerns. The best time to call is Monday-Friday from 9am-4pm, but there is someone available 24/7. If after hours or the weekend, call the main hospital number and ask for the Internal Medicine Resident On-Call. If you need medication refills, please notify your pharmacy one week in advance and they will send [place] a request.  Thank you for letting us take part in your care. Wishing you the best!  Thank you, Sanjuan Dame, MD

## 2022-05-25 DIAGNOSIS — Z7689 Persons encountering health services in other specified circumstances: Secondary | ICD-10-CM | POA: Insufficient documentation

## 2022-05-25 DIAGNOSIS — I10 Essential (primary) hypertension: Secondary | ICD-10-CM | POA: Insufficient documentation

## 2022-05-25 DIAGNOSIS — Z9189 Other specified personal risk factors, not elsewhere classified: Secondary | ICD-10-CM | POA: Insufficient documentation

## 2022-05-25 NOTE — Assessment & Plan Note (Signed)
Wayne Ward is presenting today for hospital follow-up as well as to establish care. States that he has not seen a primary care in a few years. He currently lives in [place] and would like to eventually establish care with a PCP closer to home. Reports only significant past medical history is deafness. He also reports that he is legally blind. Currently does not take any medications regularly. No known allergies or previous surgeries. Family history without cancer, diabetes. Can complete ADL's independently. Denies tobacco use. Mentions intermittent alcohol use. States social cocaine use, occasional marijuana use.

## 2022-05-25 NOTE — Assessment & Plan Note (Signed)
Wayne Ward was recently admitted to the intensive care unit due to an accidental drug overdose. States that he occasionally will do cocaine in social settings. Mentions he believes this particular batch was laced with fentanyl, which led to respiratory insufficiency and ultimately required intubation. He was discharged with doxycycline with concerns for a possible pneumonia.  Since discharge, Wayne Ward has completed his antibiotic course and feels well. Denies any difficulties breathing, cough, fever, chills, difficulty swallowing/choking. Counseled on harmful effects of cocaine.

## 2022-05-25 NOTE — Progress Notes (Signed)
   CC: hospital follow-up, establish care  HPI:  Wayne Ward is a 51 y.o. person with medical history as below presenting to [facility] for establishing care.  Please see problem-based list for further details, assessments, and plans.  Past Medical History:  Diagnosis Date   Deaf    Review of Systems:  As per HPI  Physical Exam:  Vitals:   05/23/22 1608  BP: (!) 158/105  Pulse: 64  Temp: 98 F (36.7 C)  TempSrc: Oral  SpO2: 100%  Weight: 157 lb 3.2 oz (71.3 kg)  Height: 6\' 1"  (1.854 m)   General: Resting comfortably in no acute distress CV: Regular rate, rhythm. No murmurs appreciated. Pulm: Normal work of breathing on room air. Clear to ausculation bilaterally. MSK: Normal bulk, tone. No peripheral edema. Skin: Warm, dry. No rashes or lesions. Neuro: Awake, alert, conversing appropriately.  Psych: Normal mood, affect.  Assessment & Plan:   History of drug overdose Wayne Ward was recently admitted to the intensive care unit due to an accidental drug overdose. States that he occasionally will do cocaine in social settings. Mentions he believes this particular batch was laced with fentanyl, which led to respiratory insufficiency and ultimately required intubation. He was discharged with doxycycline with concerns for a possible pneumonia.  Since discharge, Wayne Ward has completed his antibiotic course and feels well. Denies any difficulties breathing, cough, fever, chills, difficulty swallowing/choking. Counseled on harmful effects of cocaine.  Encounter to establish care Wayne Ward is presenting today for hospital follow-up as well as to establish care. States that he has not seen a primary care in a few years. He currently lives in [place] and would like to eventually establish care with a PCP closer to home. Reports only significant past medical history is deafness. He also reports that he is legally blind. Currently does not take any medications regularly. No known  allergies or previous surgeries. Family history without cancer, diabetes. Can complete ADL's independently. Denies tobacco use. Mentions intermittent alcohol use. States social cocaine use, occasional marijuana use.   Hypertension BP Readings from Last 3 Encounters:  05/23/22 (!) 158/105  05/12/22 (!) 137/100  08/31/21 (!) 153/93   Blood pressure today elevated, both systolic and diastolic. Per chart review, this has been a persistent issue for him. He currently denies chest pain, dyspnea, lightheadedness, syncopal episodes. We will initiate ARB, up-titrate as needed.  - Start valsartan 80mg  daily - Return to clinic in 76mo - BMP at next visit  Patient discussed with Dr. Andrey Spearman, MD Internal Medicine PGY-3 Pager: [phone]

## 2022-05-25 NOTE — Assessment & Plan Note (Signed)
BP Readings from Last 3 Encounters:  05/23/22 (!) 158/105  05/12/22 (!) 137/100  08/31/21 (!) 153/93   Blood pressure today elevated, both systolic and diastolic. Per chart review, this has been a persistent issue for him. He currently denies chest pain, dyspnea, lightheadedness, syncopal episodes. We will initiate ARB, up-titrate as needed.  - Start valsartan 80mg  daily - Return to clinic in 49mo - BMP at next visit

## 2022-05-29 NOTE — Progress Notes (Signed)
Internal Medicine Clinic Attending ? ?Case discussed with Dr. Braswell  at the time of the visit.  We reviewed the resident?s history and exam and pertinent patient test results.  I agree with the assessment, diagnosis, and plan of care documented in the resident?s note.  ?

## 2022-06-29 ENCOUNTER — Other Ambulatory Visit: Payer: Self-pay | Admitting: Student

## 2022-06-29 DIAGNOSIS — I10 Essential (primary) hypertension: Secondary | ICD-10-CM

## 2022-07-21 IMAGING — DX DG KNEE COMPLETE 4+V*R*
4 series · 4 of 4 positions shown · non-contrast
Comparison: None.

CLINICAL DATA: Puncture wound

EXAM:
RIGHT KNEE - COMPLETE 4+ VIEW

[knee ap]
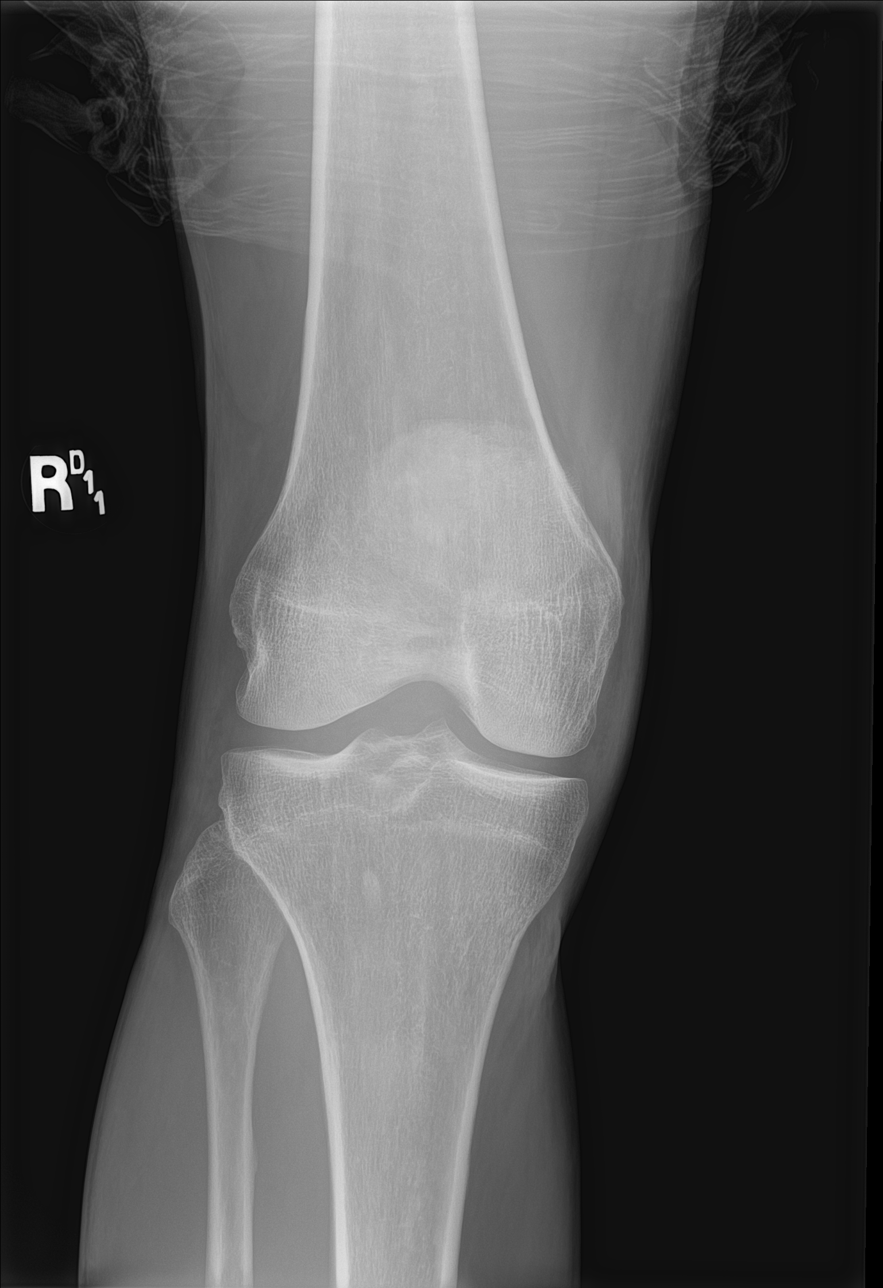

[knee obl (1 of 2)]
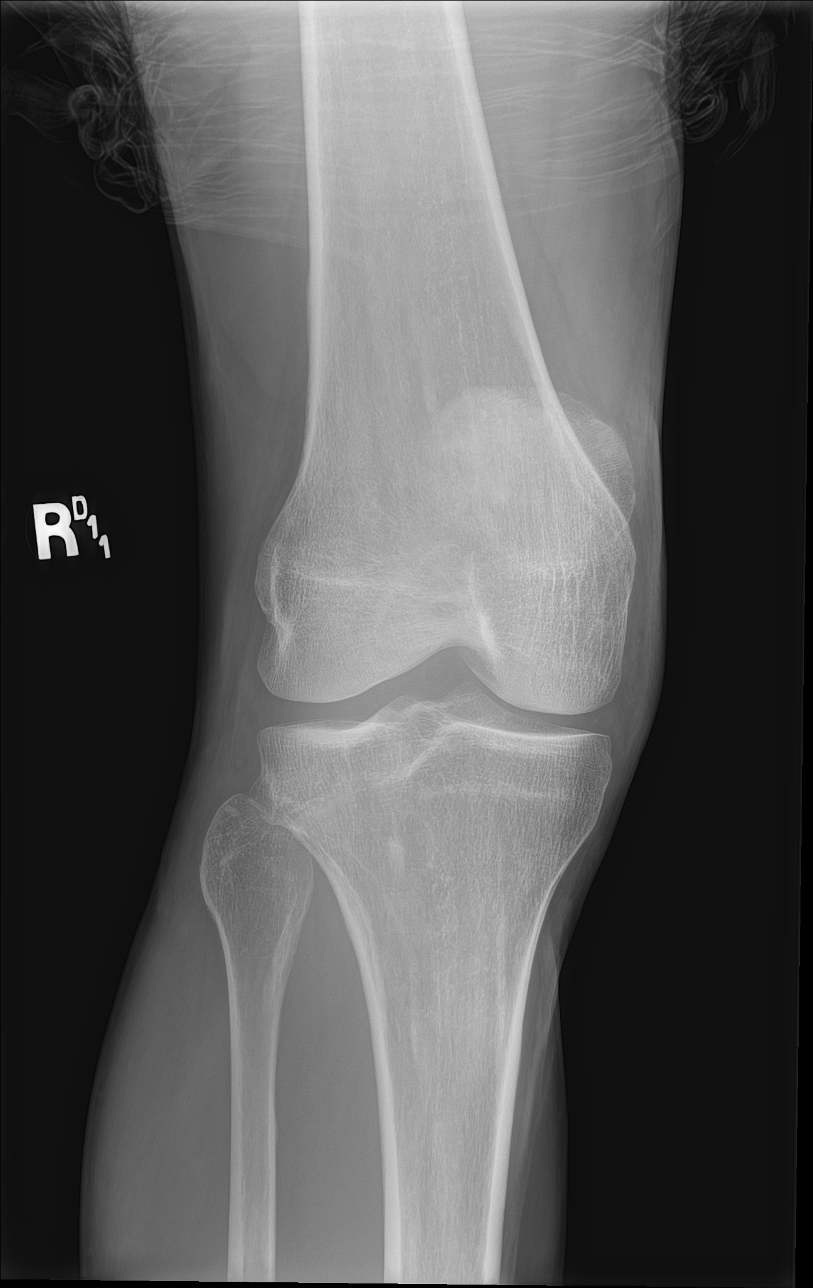

[knee obl (2 of 2)]
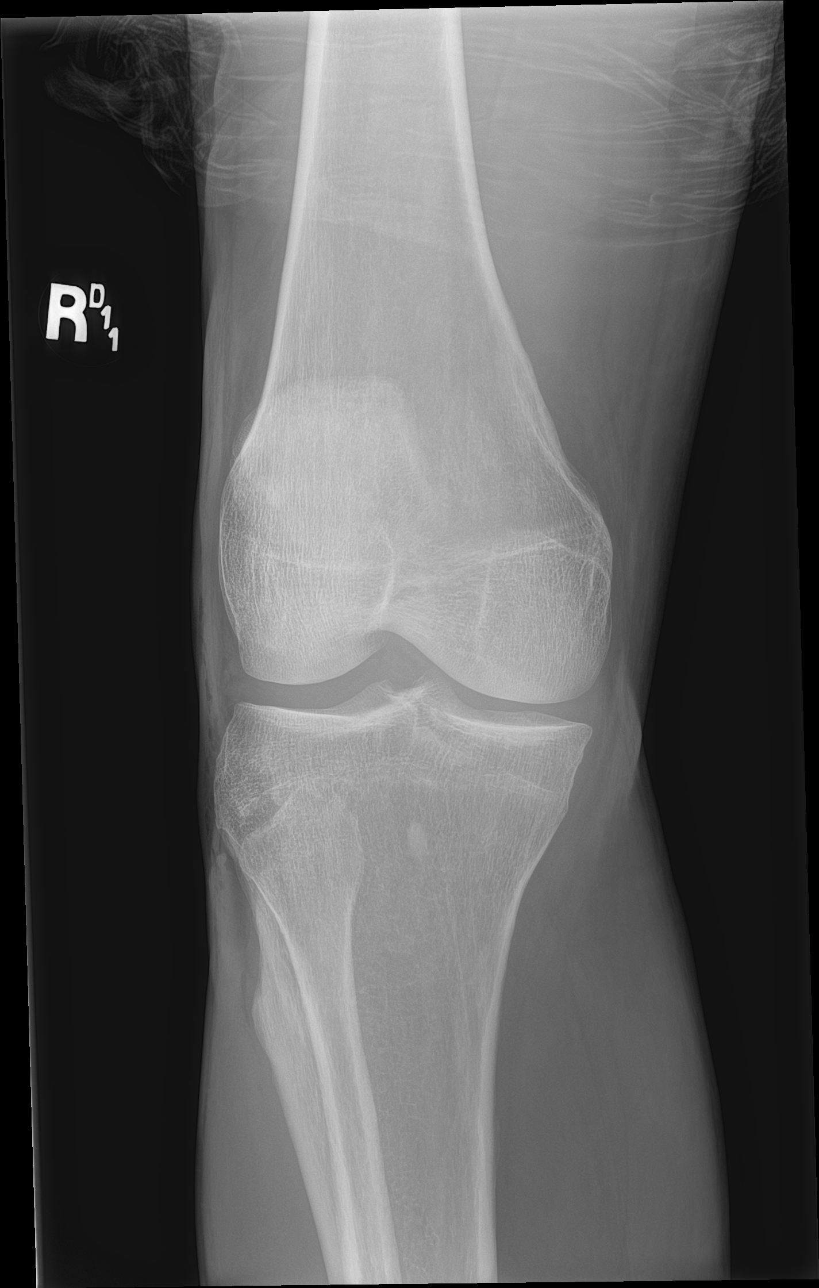

[knee lat]
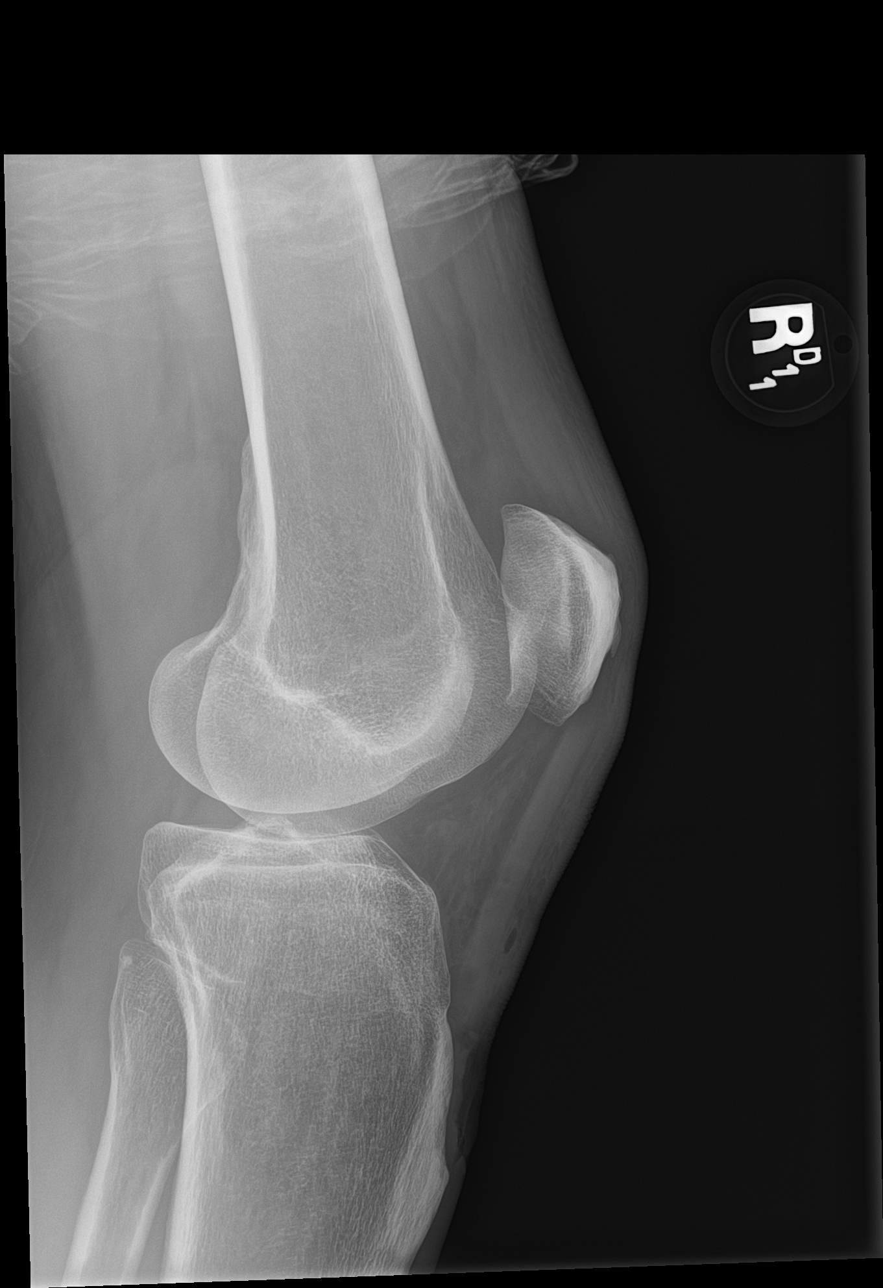

[4 of 4 positions shown; findings below may reference images not displayed]

FINDINGS: On lateral projection, small amount subcutaneous gas in the soft
tissues anterior to the tendon and inferior to the patella. No
foreign body. No fracture.
IMPRESSION: 1. No fracture or foreign body.
2. Superficial soft tissue injury as above.

## 2024-03-19 ENCOUNTER — Other Ambulatory Visit: Payer: Self-pay | Admitting: Medical Genetics

## 2024-03-21 ENCOUNTER — Other Ambulatory Visit (HOSPITAL_COMMUNITY)

## 2024-05-14 ENCOUNTER — Other Ambulatory Visit: Payer: Self-pay | Admitting: Medical Genetics

## 2024-05-14 DIAGNOSIS — Z006 Encounter for examination for normal comparison and control in clinical research program: Secondary | ICD-10-CM

## 2024-05-15 ENCOUNTER — Telehealth: Payer: Self-pay

## 2024-05-15 NOTE — Telephone Encounter (Unsigned)
 Copied from CRM [id number]. Topic: Appointments - Appointment Scheduling >> May 15, 2024 11:28 AM Tiffini S wrote: Patient/patient representative is calling to schedule an appointment. Will need to schedule sign language (deaf) for the appointment on 09/18/23. Patient asked if a appointment letter can be mailed out- transportation will be by bus

## 2024-06-03 LAB — GENECONNECT MOLECULAR SCREEN

## 2024-06-04 ENCOUNTER — Ambulatory Visit: Payer: Self-pay | Admitting: Medical Genetics

## 2024-06-04 ENCOUNTER — Telehealth: Payer: Self-pay | Admitting: Medical Genetics

## 2024-06-04 DIAGNOSIS — Z006 Encounter for examination for normal comparison and control in clinical research program: Secondary | ICD-10-CM

## 2024-06-04 NOTE — Telephone Encounter (Signed)
 [facility]  06/04/2024  3:17 PM  Confirmed I was speaking with Wayne Ward [phone] by using name and DOB. Informed participant the reason for this call is to follow-up on a recent sample the participant provided at one of the [facility] lab locations. Informed participant the test was not able to be completed with this sample and apologized for the inconvenience. Participant was requested to provide a new sample at one of our participating labs at no cost so that participant can continue participation and receive test results. Informed participant they do not need to be fasting and if there are other samples that need to be drawn, they can be done at the same visit. Participant has not had a blood transfusion or blood product in the last 30 days. Participant agreed to provide another sample. Participant was provided the [employer] program website to learn why this may have happened. Participant was thanked for their time and continued support of the above study.    Jordyn Pennstrom, BS [place]  [facility] Clinical Research Specialist II Direct Dial: [phone]  Fax: [phone]

## 2024-06-25 LAB — GENECONNECT MOLECULAR SCREEN: Genetic Analysis Overall Interpretation: NEGATIVE
